# Patient Record
Sex: Male | Born: 1955 | Race: White | Hispanic: No | Marital: Single | Smoking: Former smoker
Health system: Southern US, Community
[De-identification: ages and names within clinical notes are randomized; demographics above are authoritative.]

## PROBLEM LIST (undated history)

## (undated) DIAGNOSIS — I82409 Acute embolism and thrombosis of unspecified deep veins of unspecified lower extremity: Secondary | ICD-10-CM

## (undated) DIAGNOSIS — M549 Dorsalgia, unspecified: Secondary | ICD-10-CM

## (undated) DIAGNOSIS — R55 Syncope and collapse: Secondary | ICD-10-CM

## (undated) DIAGNOSIS — I219 Acute myocardial infarction, unspecified: Secondary | ICD-10-CM

## (undated) DIAGNOSIS — E1151 Type 2 diabetes mellitus with diabetic peripheral angiopathy without gangrene: Secondary | ICD-10-CM

## (undated) DIAGNOSIS — G8929 Other chronic pain: Secondary | ICD-10-CM

## (undated) DIAGNOSIS — I701 Atherosclerosis of renal artery: Secondary | ICD-10-CM

## (undated) DIAGNOSIS — C4491 Basal cell carcinoma of skin, unspecified: Secondary | ICD-10-CM

## (undated) DIAGNOSIS — I639 Cerebral infarction, unspecified: Secondary | ICD-10-CM

## (undated) DIAGNOSIS — R011 Cardiac murmur, unspecified: Secondary | ICD-10-CM

## (undated) DIAGNOSIS — J189 Pneumonia, unspecified organism: Secondary | ICD-10-CM

## (undated) DIAGNOSIS — IMO0001 Reserved for inherently not codable concepts without codable children: Secondary | ICD-10-CM

## (undated) DIAGNOSIS — I714 Abdominal aortic aneurysm, without rupture, unspecified: Secondary | ICD-10-CM

## (undated) DIAGNOSIS — N200 Calculus of kidney: Secondary | ICD-10-CM

## (undated) DIAGNOSIS — I2510 Atherosclerotic heart disease of native coronary artery without angina pectoris: Secondary | ICD-10-CM

## (undated) DIAGNOSIS — I10 Essential (primary) hypertension: Secondary | ICD-10-CM

## (undated) DIAGNOSIS — M1990 Unspecified osteoarthritis, unspecified site: Secondary | ICD-10-CM

## (undated) DIAGNOSIS — I519 Heart disease, unspecified: Secondary | ICD-10-CM

## (undated) DIAGNOSIS — J449 Chronic obstructive pulmonary disease, unspecified: Secondary | ICD-10-CM

## (undated) DIAGNOSIS — N289 Disorder of kidney and ureter, unspecified: Secondary | ICD-10-CM

## (undated) DIAGNOSIS — I739 Peripheral vascular disease, unspecified: Secondary | ICD-10-CM

## (undated) DIAGNOSIS — J42 Unspecified chronic bronchitis: Secondary | ICD-10-CM

## (undated) DIAGNOSIS — J45909 Unspecified asthma, uncomplicated: Secondary | ICD-10-CM

## (undated) DIAGNOSIS — E785 Hyperlipidemia, unspecified: Secondary | ICD-10-CM

## (undated) HISTORY — DX: Acute embolism and thrombosis of unspecified deep veins of unspecified lower extremity: I82.409

## (undated) HISTORY — DX: Type 2 diabetes mellitus with diabetic peripheral angiopathy without gangrene: E11.51

## (undated) HISTORY — PX: BASAL CELL CARCINOMA EXCISION: SHX1214

## (undated) HISTORY — DX: Reserved for inherently not codable concepts without codable children: IMO0001

## (undated) HISTORY — DX: Abdominal aortic aneurysm, without rupture: I71.4

## (undated) HISTORY — PX: LUMBAR LAMINECTOMY: SHX95

## (undated) HISTORY — DX: Atherosclerotic heart disease of native coronary artery without angina pectoris: I25.10

## (undated) HISTORY — PX: ILIAC ARTERY STENT: SHX1786

## (undated) HISTORY — DX: Other chronic pain: G89.29

## (undated) HISTORY — DX: Atherosclerosis of renal artery: I70.1

## (undated) HISTORY — PX: NASAL SINUS SURGERY: SHX719

## (undated) HISTORY — DX: Chronic obstructive pulmonary disease, unspecified: J44.9

## (undated) HISTORY — DX: Heart disease, unspecified: I51.9

## (undated) HISTORY — DX: Dorsalgia, unspecified: M54.9

## (undated) HISTORY — DX: Peripheral vascular disease, unspecified: I73.9

## (undated) HISTORY — PX: LITHOTRIPSY: SUR834

## (undated) HISTORY — DX: Abdominal aortic aneurysm, without rupture, unspecified: I71.40

---

## 1998-04-21 ENCOUNTER — Emergency Department (HOSPITAL_COMMUNITY): Admission: EM | Admit: 1998-04-21 | Discharge: 1998-04-21 | Payer: Self-pay | Admitting: Emergency Medicine

## 1999-05-28 DIAGNOSIS — I219 Acute myocardial infarction, unspecified: Secondary | ICD-10-CM

## 1999-05-28 DIAGNOSIS — I2510 Atherosclerotic heart disease of native coronary artery without angina pectoris: Secondary | ICD-10-CM

## 1999-05-28 HISTORY — PX: CORONARY ANGIOPLASTY WITH STENT PLACEMENT: SHX49

## 1999-05-28 HISTORY — DX: Atherosclerotic heart disease of native coronary artery without angina pectoris: I25.10

## 1999-05-28 HISTORY — DX: Acute myocardial infarction, unspecified: I21.9

## 2003-04-08 DIAGNOSIS — I2510 Atherosclerotic heart disease of native coronary artery without angina pectoris: Secondary | ICD-10-CM | POA: Diagnosis present

## 2012-03-27 DIAGNOSIS — R55 Syncope and collapse: Secondary | ICD-10-CM

## 2012-03-27 DIAGNOSIS — I519 Heart disease, unspecified: Secondary | ICD-10-CM

## 2012-03-27 HISTORY — DX: Syncope and collapse: R55

## 2012-03-27 HISTORY — DX: Heart disease, unspecified: I51.9

## 2012-04-07 ENCOUNTER — Emergency Department (HOSPITAL_COMMUNITY): Payer: Federal, State, Local not specified - PPO

## 2012-04-07 ENCOUNTER — Encounter (HOSPITAL_COMMUNITY): Payer: Self-pay | Admitting: Emergency Medicine

## 2012-04-07 ENCOUNTER — Inpatient Hospital Stay (HOSPITAL_COMMUNITY)
Admission: EM | Admit: 2012-04-07 | Discharge: 2012-04-08 | DRG: 132 | Disposition: A | Discharge status: Home / Self Care | Payer: Federal, State, Local not specified - PPO | Attending: Cardiovascular Disease | Admitting: Cardiovascular Disease

## 2012-04-07 ENCOUNTER — Inpatient Hospital Stay (HOSPITAL_COMMUNITY): Payer: Federal, State, Local not specified - PPO

## 2012-04-07 DIAGNOSIS — I252 Old myocardial infarction: Secondary | ICD-10-CM

## 2012-04-07 DIAGNOSIS — I498 Other specified cardiac arrhythmias: Secondary | ICD-10-CM | POA: Diagnosis not present

## 2012-04-07 DIAGNOSIS — I739 Peripheral vascular disease, unspecified: Secondary | ICD-10-CM | POA: Diagnosis present

## 2012-04-07 DIAGNOSIS — IMO0001 Reserved for inherently not codable concepts without codable children: Secondary | ICD-10-CM

## 2012-04-07 DIAGNOSIS — Z7982 Long term (current) use of aspirin: Secondary | ICD-10-CM

## 2012-04-07 DIAGNOSIS — Z9861 Coronary angioplasty status: Secondary | ICD-10-CM

## 2012-04-07 DIAGNOSIS — I200 Unstable angina: Secondary | ICD-10-CM

## 2012-04-07 DIAGNOSIS — R0789 Other chest pain: Secondary | ICD-10-CM | POA: Diagnosis present

## 2012-04-07 DIAGNOSIS — E119 Type 2 diabetes mellitus without complications: Secondary | ICD-10-CM | POA: Diagnosis present

## 2012-04-07 DIAGNOSIS — R55 Syncope and collapse: Secondary | ICD-10-CM | POA: Diagnosis present

## 2012-04-07 DIAGNOSIS — Z79899 Other long term (current) drug therapy: Secondary | ICD-10-CM

## 2012-04-07 DIAGNOSIS — E876 Hypokalemia: Secondary | ICD-10-CM | POA: Diagnosis present

## 2012-04-07 DIAGNOSIS — E785 Hyperlipidemia, unspecified: Secondary | ICD-10-CM

## 2012-04-07 DIAGNOSIS — I10 Essential (primary) hypertension: Secondary | ICD-10-CM | POA: Diagnosis present

## 2012-04-07 DIAGNOSIS — Z7902 Long term (current) use of antithrombotics/antiplatelets: Secondary | ICD-10-CM

## 2012-04-07 DIAGNOSIS — F17200 Nicotine dependence, unspecified, uncomplicated: Secondary | ICD-10-CM | POA: Diagnosis present

## 2012-04-07 DIAGNOSIS — I2510 Atherosclerotic heart disease of native coronary artery without angina pectoris: Principal | ICD-10-CM | POA: Diagnosis present

## 2012-04-07 HISTORY — DX: Hyperlipidemia, unspecified: E78.5

## 2012-04-07 HISTORY — DX: Reserved for inherently not codable concepts without codable children: IMO0001

## 2012-04-07 HISTORY — DX: Calculus of kidney: N20.0

## 2012-04-07 HISTORY — DX: Syncope and collapse: R55

## 2012-04-07 HISTORY — DX: Acute myocardial infarction, unspecified: I21.9

## 2012-04-07 LAB — URINALYSIS, ROUTINE W REFLEX MICROSCOPIC
Glucose, UA: NEGATIVE mg/dL
Hgb urine dipstick: NEGATIVE
Protein, ur: 100 mg/dL — AB

## 2012-04-07 LAB — HEPATIC FUNCTION PANEL
AST: 21 U/L (ref 0–37)
Alkaline Phosphatase: 86 U/L (ref 39–117)
Bilirubin, Direct: 0.1 mg/dL (ref 0.0–0.3)
Total Bilirubin: 0.4 mg/dL (ref 0.3–1.2)

## 2012-04-07 LAB — MAGNESIUM: Magnesium: 2.2 mg/dL (ref 1.5–2.5)

## 2012-04-07 LAB — HEPARIN LEVEL (UNFRACTIONATED): Heparin Unfractionated: 0.36 IU/mL (ref 0.30–0.70)

## 2012-04-07 LAB — CK TOTAL AND CKMB (NOT AT ARMC): CK, MB: 1.1 ng/mL (ref 0.3–4.0)

## 2012-04-07 LAB — TROPONIN I
Troponin I: <0.3 ng/mL (ref <0.30)
Troponin I: <0.3 ng/mL (ref <0.30)

## 2012-04-07 LAB — CBC
Hemoglobin: 15.5 g/dL (ref 13.0–17.0)
MCH: 29.7 pg (ref 26.0–34.0)
MCV: 82.4 fL (ref 78.0–100.0)
Platelets: 187 10*3/uL (ref 150–400)
RBC: 5.22 MIL/uL (ref 4.22–5.81)
WBC: 13.8 10*3/uL — ABNORMAL HIGH (ref 4.0–10.5)

## 2012-04-07 LAB — URINE MICROSCOPIC-ADD ON

## 2012-04-07 LAB — BASIC METABOLIC PANEL
CO2: 26 mEq/L (ref 19–32)
Calcium: 9.5 mg/dL (ref 8.4–10.5)
GFR calc non Af Amer: >90 mL/min (ref >90)
Glucose, Bld: 158 mg/dL — ABNORMAL HIGH (ref 70–99)
Potassium: 3.1 mEq/L — ABNORMAL LOW (ref 3.5–5.1)
Sodium: 134 mEq/L — ABNORMAL LOW (ref 135–145)

## 2012-04-07 LAB — PROTIME-INR
INR: 0.99 (ref 0.00–1.49)
Prothrombin Time: 13 seconds (ref 11.6–15.2)

## 2012-04-07 LAB — PRO B NATRIURETIC PEPTIDE: Pro B Natriuretic peptide (BNP): 2035 pg/mL — ABNORMAL HIGH (ref 0–125)

## 2012-04-07 MED ORDER — LOSARTAN POTASSIUM-HCTZ 100-25 MG PO TABS: 1.0000 | ORAL_TABLET | Freq: Every day | ORAL | Status: DC

## 2012-04-07 MED ORDER — ZOLPIDEM TARTRATE 5 MG PO TABS: 5.0000 mg | ORAL_TABLET | Freq: Every evening | ORAL | Status: DC | PRN

## 2012-04-07 MED ORDER — INSULIN ASPART 100 UNIT/ML ~~LOC~~ SOLN
0.0000 [IU] | Freq: Three times a day (TID) | SUBCUTANEOUS | Status: DC
  Administered 2012-04-08: 1 [IU] via SUBCUTANEOUS

## 2012-04-07 MED ORDER — ACETAMINOPHEN 325 MG PO TABS: 650.0000 mg | ORAL_TABLET | ORAL | Status: DC | PRN

## 2012-04-07 MED ORDER — HEPARIN BOLUS VIA INFUSION
3500.0000 [IU] | Freq: Once | INTRAVENOUS | Status: AC
  Administered 2012-04-07: 3500 [IU] via INTRAVENOUS

## 2012-04-07 MED ORDER — ASPIRIN EC 81 MG PO TBEC
81.0000 mg | DELAYED_RELEASE_TABLET | Freq: Every day | ORAL | Status: DC
  Administered 2012-04-08: 81 mg via ORAL
  Filled 2012-04-07: qty 1

## 2012-04-07 MED ORDER — ALBUTEROL SULFATE HFA 108 (90 BASE) MCG/ACT IN AERS: 2.0000 | INHALATION_SPRAY | Freq: Four times a day (QID) | RESPIRATORY_TRACT | Status: DC | PRN

## 2012-04-07 MED ORDER — NITROGLYCERIN 0.4 MG SL SUBL
0.4000 mg | SUBLINGUAL_TABLET | SUBLINGUAL | Status: DC | PRN
  Administered 2012-04-07: 0.4 mg via SUBLINGUAL
  Filled 2012-04-07: qty 25

## 2012-04-07 MED ORDER — ONDANSETRON HCL 4 MG/2ML IJ SOLN: 4.0000 mg | Freq: Four times a day (QID) | INTRAMUSCULAR | Status: DC | PRN

## 2012-04-07 MED ORDER — HYDROCHLOROTHIAZIDE 25 MG PO TABS
25.0000 mg | ORAL_TABLET | Freq: Every day | ORAL | Status: DC
  Administered 2012-04-08: 25 mg via ORAL
  Filled 2012-04-07: qty 1

## 2012-04-07 MED ORDER — FOLIC ACID 1 MG PO TABS
1.0000 mg | ORAL_TABLET | Freq: Two times a day (BID) | ORAL | Status: DC
  Administered 2012-04-07 – 2012-04-08 (×2): 1 mg via ORAL
  Filled 2012-04-07 (×3): qty 1

## 2012-04-07 MED ORDER — HYDROCODONE-ACETAMINOPHEN 5-325 MG PO TABS: 1.0000 | ORAL_TABLET | Freq: Four times a day (QID) | ORAL | Status: DC | PRN

## 2012-04-07 MED ORDER — HEPARIN (PORCINE) IN NACL 100-0.45 UNIT/ML-% IJ SOLN
1200.0000 [IU]/h | INTRAMUSCULAR | Status: DC
  Filled 2012-04-07 (×2): qty 250

## 2012-04-07 MED ORDER — AMLODIPINE-ATORVASTATIN 10-80 MG PO TABS: 1.0000 | ORAL_TABLET | Freq: Every day | ORAL | Status: DC

## 2012-04-07 MED ORDER — POTASSIUM CHLORIDE CRYS ER 20 MEQ PO TBCR
40.0000 meq | EXTENDED_RELEASE_TABLET | Freq: Once | ORAL | Status: AC
  Administered 2012-04-07: 40 meq via ORAL
  Filled 2012-04-07: qty 2

## 2012-04-07 MED ORDER — CLOPIDOGREL BISULFATE 75 MG PO TABS
75.0000 mg | ORAL_TABLET | Freq: Every day | ORAL | Status: DC
Start: 2012-04-08 — End: 2012-04-08
  Administered 2012-04-08: 75 mg via ORAL
  Filled 2012-04-07 (×2): qty 1

## 2012-04-07 MED ORDER — LOSARTAN POTASSIUM 50 MG PO TABS
100.0000 mg | ORAL_TABLET | Freq: Every day | ORAL | Status: DC
  Administered 2012-04-08: 100 mg via ORAL
  Filled 2012-04-07: qty 2

## 2012-04-07 MED ORDER — AMLODIPINE BESYLATE 10 MG PO TABS
10.0000 mg | ORAL_TABLET | Freq: Every day | ORAL | Status: DC
  Administered 2012-04-08: 10 mg via ORAL
  Filled 2012-04-07: qty 1

## 2012-04-07 MED ORDER — NITROGLYCERIN 0.4 MG SL SUBL: 0.4000 mg | SUBLINGUAL_TABLET | SUBLINGUAL | Status: DC | PRN

## 2012-04-07 MED ORDER — HEPARIN (PORCINE) IN NACL 100-0.45 UNIT/ML-% IJ SOLN
12.0000 [IU]/kg/h | INTRAMUSCULAR | Status: DC
  Administered 2012-04-07: 12 [IU]/kg/h via INTRAVENOUS
  Filled 2012-04-07: qty 250

## 2012-04-07 MED ORDER — ASPIRIN 81 MG PO CHEW
324.0000 mg | CHEWABLE_TABLET | Freq: Once | ORAL | Status: AC
  Administered 2012-04-07: 324 mg via ORAL
  Filled 2012-04-07: qty 4

## 2012-04-07 MED ORDER — SODIUM CHLORIDE 0.9 % IV SOLN
Freq: Once | INTRAVENOUS | Status: AC
  Administered 2012-04-07: 15:00:00 via INTRAVENOUS

## 2012-04-07 MED ORDER — ATORVASTATIN CALCIUM 80 MG PO TABS
80.0000 mg | ORAL_TABLET | Freq: Every day | ORAL | Status: DC
  Administered 2012-04-08: 80 mg via ORAL
  Filled 2012-04-07: qty 1

## 2012-04-07 MED ORDER — POTASSIUM CHLORIDE CRYS ER 20 MEQ PO TBCR
20.0000 meq | EXTENDED_RELEASE_TABLET | Freq: Two times a day (BID) | ORAL | Status: DC
  Administered 2012-04-07 – 2012-04-08 (×2): 20 meq via ORAL
  Filled 2012-04-07 (×3): qty 1

## 2012-04-07 MED ORDER — LABETALOL HCL 300 MG PO TABS
300.0000 mg | ORAL_TABLET | Freq: Two times a day (BID) | ORAL | Status: DC
  Administered 2012-04-07 – 2012-04-08 (×2): 300 mg via ORAL
  Filled 2012-04-07 (×3): qty 1

## 2012-04-07 NOTE — Progress Notes (Addendum)
ANTICOAGULATION CONSULT NOTE - Initial Consult  Pharmacy Consult for Heparin Indication: Chest Pain - ACS  No Known Allergies  Patient Measurements: Height: 5\' 8"  (172.7 cm) Weight: 152 lb (68.947 kg) IBW/kg (Calculated) : 68.4  Heparin Dosing Weight 68 kg  Vital Signs: Temp: 97.4 F (36.3 C) (11/12 1010) Temp src: Oral (11/12 1010) BP: 147/72 mmHg (11/12 1603) Pulse Rate: 53  (11/12 1603)  Labs:  Basename 04/07/12 1604 04/07/12 1315 04/07/12 1047  HGB -- -- 15.5  HCT -- -- 43.0  PLT -- -- 187  APTT -- 26 --  LABPROT -- 13.0 --  INR -- 0.99 --  HEPARINUNFRC -- -- --  CREATININE -- -- 0.95  CKTOTAL 17 -- --  CKMB 1.1 -- --  TROPONINI <0.30 -- <0.30   Estimated Creatinine Clearance: 85 ml/min (by C-G formula based on Cr of 0.95).  Medical History: Past Medical History  Diagnosis Date  . Diabetes mellitus without complication   . MI (myocardial infarction)   . Kidney stone   . Chest pressure, at rest 04/07/2012  . Near syncope 04/07/2012  . DM (diabetes mellitus), diet controlled 04/07/2012  . Hyperlipidemia 04/07/2012   Medications:  Prescriptions prior to admission  Medication Sig Dispense Refill  . albuterol (PROVENTIL HFA;VENTOLIN HFA) 108 (90 BASE) MCG/ACT inhaler Inhale 2 puffs into the lungs every 6 (six) hours as needed. breathing      . amLODipine-atorvastatin (CADUET) 10-80 MG per tablet Take 1 tablet by mouth daily.      . clopidogrel (PLAVIX) 75 MG tablet Take 75 mg by mouth daily.      . folic acid (FOLVITE) 1 MG tablet Take 1 mg by mouth 2 (two) times daily.      [name] HYDROcodone-acetaminophen (NORCO/VICODIN) 5-325 MG per tablet Take 1 tablet by mouth every 6 (six) hours as needed. Pain      . labetalol (NORMODYNE) 300 MG tablet Take 300 mg by mouth 2 (two) times daily.      [name] losartan-hydrochlorothiazide (HYZAAR) 100-25 MG per tablet Take 1 tablet by mouth daily.      . potassium chloride (K-DUR,KLOR-CON) 10 MEQ tablet Take 20 mEq by mouth 2 (two)  times daily.        Assessment: 56yo male with complaints of chest pressure, weakness and dizziness.  He has a history of STEMI in the past for which he has had a PCI with stent.  He has normal renal function with Creatinine = 0.95 and an estimated clearance of 62ml/min.  His hemoglobin is normal as is his platelets and he has no s/s of bleeding complications.  No noted recent surgical procedures or history of head bleeds.  We have been asked to initiate IV heparin therapy until full cardiac work-up is completed.  Goal of Therapy:  Heparin level 0.3-0.7 units/ml Monitor platelets by anticoagulation protocol: Yes   Plan:   Heparin was started at [facility] with 3500 unit bolus and drip at 850 units/hr (10ut/kg) at 14:30PM.  Will increase rate to 1200 units/hr (~ 17ut/kg)  Obtain a heparin level 8 hours after IV start.  Daily heparin level and CBC to monitor for bleeding.  Nadara Mustard, PharmD., MS Clinical Pharmacist Pager:  [phone] Thank you for allowing pharmacy to be part of this patients care team. 04/07/2012,5:38 PM

## 2012-04-07 NOTE — Progress Notes (Signed)
ANTICOAGULATION CONSULT NOTE - Initial Consult  Pharmacy Consult for Heparin Indication: chest pain/ACS  No Known Allergies  Patient Measurements: Height: 5\' 8"  (172.7 cm) Weight: 152 lb (68.947 kg) IBW/kg (Calculated) : 68.4  Heparin Dosing Weight: 64kg.  Vital Signs: Temp: 97.4 F (36.3 C) (11/12 1010) Temp src: Oral (11/12 1010) BP: 110/62 mmHg (11/12 1108) Pulse Rate: 53  (11/12 1108)  Labs:  Basename 04/07/12 1047  HGB 15.5  HCT 43.0  PLT 187  APTT --  LABPROT --  INR --  HEPARINUNFRC --  CREATININE 0.95  CKTOTAL --  CKMB --  TROPONINI <0.30    Estimated Creatinine Clearance: 85 ml/min (by C-G formula based on Cr of 0.95).   Medical History: Past Medical History  Diagnosis Date  . Diabetes mellitus without complication   . MI (myocardial infarction)   . Kidney stone     Medications:   (Not in a hospital admission)  Assessment:  56yo M presented to [facility] ER with L arm tingling, weakness, nausea, diaphoresis.   Starting heparin for ACS.  No baseline coags drawn yet.  CBC is ok.  Goal of Therapy:  Heparin level 0.3-0.7 units/ml Monitor platelets by anticoagulation protocol: Yes   Plan:   Draw baseline coags.  Heparin 3500 units x 1 then 850 units/hr.  Check heparin level in 8hrs.  Charolotte Eke, PharmD, pager [phone]. 04/07/2012,1:02 PM.

## 2012-04-07 NOTE — ED Notes (Signed)
Pt states he had a near syncopal episode when head of bed raised for x ray, decreased with lowering head of bed.  BP 110/62, hr 56

## 2012-04-07 NOTE — Progress Notes (Signed)
Pt confirmed pt Max [name] at [facility] EPIC updated

## 2012-04-07 NOTE — ED Notes (Signed)
Bed:WA19<BR> Expected date:<BR> Expected time:<BR> Means of arrival:<BR> Comments:<BR> Hold for triage

## 2012-04-07 NOTE — H&P (Signed)
Max [name] is an 56 y.o. male.    Cardiologist:  New  Chief Complaint: chest pressure, weakness dizziness, lt face and arm numbness  HPI: 56 year old W male with history of STEMI in 2001 at [facility] in [place], with RCA stenosis undergoing Stent to RCA.  Has been on Plavix since that time.  He believes he had a stress test a year ago that was ok.   Other history includes diabetes mellitus no longer on medication due to weight loss of 30 pounds since July of this year, hypertension hyperlipidemia, back surgery x3 L5-S1, history kidney stones. Other history includes peripheral vascular disease with 3 stents to the left iliac 2 stents to the right iliac and was told he had right renal artery stenosis but there was nothing that could be done for this. Recent abdominal ultrasound did find his right kidney was smaller than his left.  This morning he began having jaw numbness from his jaw up into his left ear and then his left arm felt somewhat he felt lightheaded and with dark spots in front of his eyes he also had some chest discomfort.  In the ER no acute changes on his EKG and initial cardiac markers are negative.  On exam he was noted to have heart rates in the 50s. Also while in the ER just sitting up on the stretcher caused him to become very dizzy.    Patient has several issues weight loss without trying, history of coronary disease with previous MI and stent to the RCA, and now bradycardia which may or may not be affecting this episode today.    Past Medical History  Diagnosis Date  . Diabetes mellitus without complication   . MI (myocardial infarction)   . Kidney stone   . Chest pressure, at rest 04/07/2012  . Near syncope 04/07/2012  . DM (diabetes mellitus), diet controlled 04/07/2012  . Hyperlipidemia 04/07/2012    Past Surgical History  Procedure Date  . Back surgery   . Coronary angioplasty with stent placement     History reviewed. No pertinent family history. Social  History:  reports that he has been smoking Cigarettes.  He has never used smokeless tobacco. He reports that he does not drink alcohol or use illicit drugs.  Allergies: No Known Allergies  Outpatient medications: Labetalol 300 mg twice a day Albuterol inhaler 2 puffs every 6 hours as needed Caduet 10-80 one daily Plavix 75 mg daily Folic acid 1 mg twice a day Hydrocodone Tylenol 5-325 one tablet every 6 hours as needed for pain Hyzaar 100-25 one daily Potassium 10 mEq, 20 mEq by mouth twice a day   Results for orders placed during the hospital encounter of 04/07/12 (from the past 48 hour(s))  BASIC METABOLIC PANEL     Status: Abnormal   Collection Time   04/07/12 10:47 AM      Component Value Range Comment   Sodium 134 (*) 135 - 145 mEq/L    Potassium 3.1 (*) 3.5 - 5.1 mEq/L    Chloride 96  96 - 112 mEq/L    CO2 26  19 - 32 mEq/L    Glucose, Bld 158 (*) 70 - 99 mg/dL    BUN 12  6 - 23 mg/dL    Creatinine, Ser 4.09  0.50 - 1.35 mg/dL    Calcium 9.5  8.4 - 81.1 mg/dL    GFR calc non Af Amer >90  >90 mL/min    GFR calc Af Amer >90  >  90 mL/min   CBC     Status: Abnormal   Collection Time   04/07/12 10:47 AM      Component Value Range Comment   WBC 13.8 (*) 4.0 - 10.5 K/uL    RBC 5.22  4.22 - 5.81 MIL/uL    Hemoglobin 15.5  13.0 - 17.0 g/dL    HCT 40.9  81.1 - 91.4 %    MCV 82.4  78.0 - 100.0 fL    MCH 29.7  26.0 - 34.0 pg    MCHC 36.0  30.0 - 36.0 g/dL    RDW 78.2  95.6 - 21.3 %    Platelets 187  150 - 400 K/uL   TROPONIN I     Status: Normal   Collection Time   04/07/12 10:47 AM      Component Value Range Comment   Troponin I <0.30  <0.30 ng/mL   APTT     Status: Normal   Collection Time   04/07/12  1:15 PM      Component Value Range Comment   aPTT 26  24 - 37 seconds   PROTIME-INR     Status: Normal   Collection Time   04/07/12  1:15 PM      Component Value Range Comment   Prothrombin Time 13.0  11.6 - 15.2 seconds    INR 0.99  0.00 - 1.49   TROPONIN I      Status: Normal   Collection Time   04/07/12  4:04 PM      Component Value Range Comment   Troponin I <0.30  <0.30 ng/mL   PRO B NATRIURETIC PEPTIDE     Status: Abnormal   Collection Time   04/07/12  4:04 PM      Component Value Range Comment   Pro B Natriuretic peptide (BNP) 2035.0 (*) 0 - 125 pg/mL   CK TOTAL AND CKMB     Status: Normal   Collection Time   04/07/12  4:04 PM      Component Value Range Comment   Total CK 17  7 - 232 U/L    CK, MB 1.1  0.3 - 4.0 ng/mL    Relative Index RELATIVE INDEX IS INVALID  0.0 - 2.5    Ct Head Wo Contrast  04/07/2012  *RADIOLOGY REPORT*  Clinical Data: Left arm numbness  CT HEAD WITHOUT CONTRAST  Technique:  Contiguous axial images were obtained from the base of the skull through the vertex without contrast.  Comparison: None.  Findings: Ventricles are normal in size.  Negative for acute or chronic infarct.  Negative for hemorrhage or mass lesion. Calvarium is intact.  Sinusitis with mucosal edema in the paranasal sinuses.  IMPRESSION: No significant intracranial abnormality.  Sinusitis.   Original Report Authenticated By: Janeece Riggers, M.D.    Dg Chest Port 1 View  04/07/2012  *RADIOLOGY REPORT*  Clinical Data: Chest pain, diaphoresis, weakness, history smoking, coronary stenting  PORTABLE CHEST - 1 VIEW  Comparison: Portable exam 1102 hours without priors for comparison  Findings: Lateral right costophrenic angle excluded. Normal heart size, mediastinal contours, and pulmonary vascularity. Peribronchial thickening without infiltrate, pleural effusion, or pneumothorax. No acute osseous findings.  IMPRESSION: Mild bronchitic changes.   Original Report Authenticated By: Ulyses Southward, M.D.     ROS: General:no colds or fevers, + weight changes With loss of 30 pounds without trying since July of this year Skin:no rashes or ulcers HEENT:no blurred vision, no congestion CV:see HPI PUL:see HPI GI:no diarrhea  constipation or melena, no indigestion GU:no  hematuria, no dysuria MS:no joint pain, no claudication, back pain, with hx of 3 surgerys, Denies claudication Neuro:near syncope earlier today, no lightheadedness Endo:diet controlled diabetes at this time, no thyroid disease   Blood pressure 147/72, pulse 53, temperature 97.4 F (36.3 C), temperature source Oral, resp. rate 15, height 5\' 8"  (1.727 m), weight 68.947 kg (152 lb), SpO2 98.00%. PE: General:Alert oriented white male no acute distress currently pleasant affect Skin:Warm and dry brisk capillary refill HEENT:Normocephalic sclera clear Neck:Supple no JVD no carotid bruits Heart:S1-S2 regular rate and rhythm without murmur gallop rub or click Lungs:Clear without rales or wheezes ZOX:WRUEAVW soft nontender positive bowel sounds do not palpate liver spleen or mass Ext:No edema difficulty palpating pedal pulse Neuro:Near syncope earlier but currently alert and oriented x3 follows commands moves all extremities    Assessment/Plan Principal Problem:  *Near syncope Active Problems:  Chest pressure, at rest  CAD (coronary artery disease) with inf MI in 2001 and stent to RCA  DM (diabetes mellitus), diet controlled  HTN (hypertension)  Hyperlipidemia  PLAN:admit to rule out MI, check CT head, IV heparin, check orthostatic BP, 2D echo.  MD to see.    INGOLD,LAURA R 04/07/2012, 4:55 PM  I seen and evaluated the patient this evening after Ms. Annie Paras, NP. I agree with her findings, examination as well as impression recommendations. He has had 2 sets of negative biomarkers, but BNP elevated, with CXR evidence of pulmonary edema, rales on exam or shortness of breath. On my examination at this time ECG done quite well is no longer any warning symptoms. He did note he had another episode of the near syncope type symptoms of dizziness and blurry with spots in her thighs when sitting at his chest x-ray earlier today. This morning when he developed initial symptoms he drank a glass of orange  juice, ate a candy bar and some fruit without much help. He got concerned when he had the numbness in his left jaw going to see her in her left arm. When asked him what his original MI symptom was 2001, he noted severe chest pain and palate were sharp arteries of her head. He did not have any arm numbness at that time. He denies any shortness of breath.   Agree with ruling out MI and checking orthostatic pressures alone an echocardiogram with bubble study. Also add carotid Dopplers as I heard a soft bruit in his left carotid.  Despite this is not similar to his previous angina he did not have diagnosed diabetes at the time of his MI. He now has had significant weight loss of the last several months with early satiety during meals and has had a relatively significant workup by his primary physician.  This does sound a little bit like gastroparesis and if this the case he may very well have enough diabetes were used like to have atypical symptoms for angina. He certainly is at risk for recurrence of significant  coronary disease -- but also recommend checking a Lexiscan Myoview to evaluate for ischemia.  He's had some relatively significant blood pressure swings here making the checking orthostatic pressures very pertinent especially with his symptoms occurring with sitting up forgetting his chest x-ray. With diabetes and would suggest the possibility of autonomic dystonia related vasopressor type of syncope.  Would also consider possible tilt table test which can be done as an outpatient wasn't ischemic evaluation is performed.  He has been somewhat bradycardic but is on high-dose  of beta blocker monitoring on telemetry to look for any arrhythmias or severe bradycardia.   Hypokalemia on labs -- on replacement doses, reassess in AM.  Time spent by me with the patient: 15 minute and 15 minutes on chart  HARDING,DAVID W, M.D., M.S. [facility] [address] [place], [place]  [address]  [phone] Pager # [phone] 04/07/2012 9:58 PM

## 2012-04-07 NOTE — ED Provider Notes (Addendum)
History     CSN: [id number]  Arrival date & time 04/07/12  1308   First MD Initiated Contact with Patient 04/07/12 1037      Chief Complaint  Patient presents with  . Fatigue    (Consider location/radiation/quality/duration/timing/severity/associated sxs/prior treatment) HPI Patient developed generalized weakness and numbness in his left arm and left face and pain in his left arm onset 9 AM today. Associated symptoms include diaphoresis and nausea. Symptoms worse with exertion and improved with rest denies chest pain. Symptoms similar to heart attack he had several years ago. No treatment prior to coming here. No visual changes. Treated himself with his usual morning medications, including his antihypertensives and Plavix presently the symptoms are mild to moderate. He denies chest pain Past Medical History  Diagnosis Date  . Diabetes mellitus without complication   . MI (myocardial infarction)   . Kidney stone     Past Surgical History  Procedure Date  . Back surgery   . Coronary angioplasty with stent placement     History reviewed. No pertinent family history.  History  Substance Use Topics  . Smoking status: Current Every Day Smoker  . Smokeless tobacco: Not on file  . Alcohol Use: No      Review of Systems  HENT: Negative.   Respiratory: Negative.   Cardiovascular: Negative.   Gastrointestinal: Positive for nausea.  Musculoskeletal: Negative.   Skin: Negative.   Neurological: Positive for weakness and numbness.  Hematological: Negative.   Psychiatric/Behavioral: Negative.   All other systems reviewed and are negative.    Allergies  Review of patient's allergies indicates no known allergies.  Home Medications   Current Outpatient Rx  Name  Route  Sig  Dispense  Refill  . ALBUTEROL SULFATE HFA 108 (90 BASE) MCG/ACT IN AERS   Inhalation   Inhale 2 puffs into the lungs every 6 (six) hours as needed. breathing         . AMLODIPINE-ATORVASTATIN  10-80 MG PO TABS   Oral   Take 1 tablet by mouth daily.         [name] CLOPIDOGREL BISULFATE 75 MG PO TABS   Oral   Take 75 mg by mouth daily.         [name] FOLIC ACID 1 MG PO TABS   Oral   Take 1 mg by mouth 2 (two) times daily.         [name] HYDROCODONE-ACETAMINOPHEN 5-325 MG PO TABS   Oral   Take 1 tablet by mouth every 6 (six) hours as needed. Pain         . LABETALOL HCL 300 MG PO TABS   Oral   Take 300 mg by mouth 2 (two) times daily.         [name] LOSARTAN POTASSIUM-HCTZ 100-25 MG PO TABS   Oral   Take 1 tablet by mouth daily.         [name] POTASSIUM CHLORIDE CRYS ER 10 MEQ PO TBCR   Oral   Take 20 mEq by mouth 2 (two) times daily.           BP 149/82  Pulse 52  Temp 97.4 F (36.3 C) (Oral)  Resp 14  SpO2 98%  Physical Exam  Nursing note and vitals reviewed. Constitutional: He appears well-developed and well-nourished.  HENT:  Head: Normocephalic and atraumatic.  Eyes: Conjunctivae normal are normal. Pupils are equal, round, and reactive to light.  Neck: Neck supple. No tracheal deviation present. No thyromegaly present.  Cardiovascular: Regular rhythm.   No murmur heard.      Bradycardic  Pulmonary/Chest: Effort normal and breath sounds normal.  Abdominal: Soft. Bowel sounds are normal. He exhibits no distension. There is no tenderness.  Musculoskeletal: Normal range of motion. He exhibits no edema and no tenderness.  Neurological: He is alert. Coordination normal.  Skin: Skin is warm and dry. No rash noted.  Psychiatric: He has a normal mood and affect.    ED Course  Procedures (including critical care time)  Date: 04/07/2012  10:12 am  Rate: 50  Rhythm: sinus bradycardia  QRS Axis: normal  Intervals: normal  ST/T Wave abnormalities: nonspecific ST/T changes  Conduction Disutrbances:none  Narrative Interpretation:   Old EKG Reviewed: none available 11:05 a.m. patient states he felt worse after treatment with one sublingual nitroglycerin. Further  nitroglycerin withheld. 11:30 aM he states he feels left arm pain is improved and currently minimal  Labs Reviewed  BASIC METABOLIC PANEL  CBC  TROPONIN I   No results found.  Date: 04/07/2012  Rate: 55  Rhythm: sinus bradycardia  QRS Axis: normal  Intervals: normal  ST/T Wave abnormalities: nonspecific ST/T changes  Conduction Disutrbances:none  Narrative Interpretation:   Old EKG Reviewed: Unchanged from the initial EKG performed at 10:12 AM today  chest x-ray reviewed by m for #4e   No diagnosis found.  Results for orders placed during the hospital encounter of 04/07/12  BASIC METABOLIC PANEL      Component Value Range   Sodium 134 (*) 135 - 145 mEq/L   Potassium 3.1 (*) 3.5 - 5.1 mEq/L   Chloride 96  96 - 112 mEq/L   CO2 26  19 - 32 mEq/L   Glucose, Bld 158 (*) 70 - 99 mg/dL   BUN 12  6 - 23 mg/dL   Creatinine, Ser 1.61  0.50 - 1.35 mg/dL   Calcium 9.5  8.4 - 09.6 mg/dL   GFR calc non Af Amer >90  >90 mL/min   GFR calc Af Amer >90  >90 mL/min  CBC      Component Value Range   WBC 13.8 (*) 4.0 - 10.5 K/uL   RBC 5.22  4.22 - 5.81 MIL/uL   Hemoglobin 15.5  13.0 - 17.0 g/dL   HCT 04.5  40.9 - 81.1 %   MCV 82.4  78.0 - 100.0 fL   MCH 29.7  26.0 - 34.0 pg   MCHC 36.0  30.0 - 36.0 g/dL   RDW 91.4  78.2 - 95.6 %   Platelets 187  150 - 400 K/uL  TROPONIN I      Component Value Range   Troponin I <0.30  <0.30 ng/mL   Dg Chest Port 1 View  04/07/2012  *RADIOLOGY REPORT*  Clinical Data: Chest pain, diaphoresis, weakness, history smoking, coronary stenting  PORTABLE CHEST - 1 VIEW  Comparison: Portable exam 1102 hours without priors for comparison  Findings: Lateral right costophrenic angle excluded. Normal heart size, mediastinal contours, and pulmonary vascularity. Peribronchial thickening without infiltrate, pleural effusion, or pneumothorax. No acute osseous findings.  IMPRESSION: Mild bronchitic changes.   Original Report Authenticated By: Ulyses Southward, M.D.    1:50 PM  patient asymptomatic.   MDM  Concern for unstable angina given the patient's symptoms with exertional component and multiple risk factors. Spoke with [facility]. Will see patient in ED Suggests heparin Diagnoses #1 unstable angina #2 hyperglycemia #3 tobacco abuse  #4 hypokalemia      Doug Sou, MD  04/07/12 1414  4:15 PM patient be transferred to [facility] per cardiology request patient remains asymptomatic presently CRITICAL CARE Performed by: Doug Sou   Total critical care time: 30 minute  Critical care time was exclusive of separately billable procedures and treating other patients.  Critical care was necessary to treat or prevent imminent or life-threatening deterioration.  Critical care was time spent personally by me on the following activities: development of treatment plan with patient and/or surrogate as well as nursing, discussions with consultants, evaluation of patient's response to treatment, examination of patient, obtaining history from patient or surrogate, ordering and performing treatments and interventions, ordering and review of laboratory studies, ordering and review of radiographic studies, pulse oximetry and re-evaluation of patient's condition.  Doug Sou, MD 04/07/12 [id number]

## 2012-04-07 NOTE — H&P (Signed)
I have seen and examined the pt.  Will admit to tele.r/o MI.  Evaluate glucose.  Check ct head.  Will complete H&P.

## 2012-04-07 NOTE — ED Notes (Signed)
Pt states he began having L arm tingling and weakness that spread up to L jaw and ear at 0900.  PT states this was accompanied by nausea and vision changes.

## 2012-04-08 ENCOUNTER — Inpatient Hospital Stay (HOSPITAL_COMMUNITY): Payer: Federal, State, Local not specified - PPO

## 2012-04-08 DIAGNOSIS — R55 Syncope and collapse: Secondary | ICD-10-CM

## 2012-04-08 LAB — LIPID PANEL
Cholesterol: 149 mg/dL (ref 0–200)
Total CHOL/HDL Ratio: 4.8 RATIO

## 2012-04-08 LAB — GLUCOSE, CAPILLARY
Glucose-Capillary: 105 mg/dL — ABNORMAL HIGH (ref 70–99)
Glucose-Capillary: 132 mg/dL — ABNORMAL HIGH (ref 70–99)

## 2012-04-08 LAB — HEPARIN LEVEL (UNFRACTIONATED): Heparin Unfractionated: 0.36 IU/mL (ref 0.30–0.70)

## 2012-04-08 LAB — CBC
Hemoglobin: 14 g/dL (ref 13.0–17.0)
MCH: 29.1 pg (ref 26.0–34.0)
MCV: 83.2 fL (ref 78.0–100.0)
RBC: 4.81 MIL/uL (ref 4.22–5.81)

## 2012-04-08 LAB — BASIC METABOLIC PANEL
CO2: 26 mEq/L (ref 19–32)
Calcium: 8.8 mg/dL (ref 8.4–10.5)
Creatinine, Ser: 1.19 mg/dL (ref 0.50–1.35)
Glucose, Bld: 107 mg/dL — ABNORMAL HIGH (ref 70–99)

## 2012-04-08 LAB — HEMOGLOBIN A1C: Hgb A1c MFr Bld: 6.5 % — ABNORMAL HIGH (ref <5.7)

## 2012-04-08 MED ORDER — TECHNETIUM TC 99M SESTAMIBI GENERIC - CARDIOLITE
10.0000 | Freq: Once | INTRAVENOUS | Status: AC | PRN
  Administered 2012-04-08: 10 via INTRAVENOUS

## 2012-04-08 MED ORDER — REGADENOSON 0.4 MG/5ML IV SOLN
0.4000 mg | Freq: Once | INTRAVENOUS | Status: AC
  Administered 2012-04-08: 0.4 mg via INTRAVENOUS

## 2012-04-08 MED ORDER — TECHNETIUM TC 99M SESTAMIBI GENERIC - CARDIOLITE
30.0000 | Freq: Once | INTRAVENOUS | Status: AC | PRN
  Administered 2012-04-08: 30 via INTRAVENOUS

## 2012-04-08 MED ORDER — ASPIRIN 81 MG PO TBEC: 81.0000 mg | DELAYED_RELEASE_TABLET | Freq: Every day | ORAL | Status: DC

## 2012-04-08 MED ORDER — NITROGLYCERIN 0.4 MG SL SUBL: 0.4000 mg | SUBLINGUAL_TABLET | SUBLINGUAL | Status: AC | PRN

## 2012-04-08 MED ORDER — LABETALOL HCL 200 MG PO TABS: 200.0000 mg | ORAL_TABLET | Freq: Two times a day (BID) | ORAL | Status: DC

## 2012-04-08 MED ORDER — LABETALOL HCL 200 MG PO TABS
200.0000 mg | ORAL_TABLET | Freq: Two times a day (BID) | ORAL | Status: DC
  Filled 2012-04-08: qty 1

## 2012-04-08 NOTE — ED Notes (Signed)
Max [name], [place] at bedside.  Questions asked and answered with verbalized understanding for cardiac stress test with myoview using lexisan. Consent obtained.PA at bedside for start and throughout exam

## 2012-04-08 NOTE — Progress Notes (Signed)
  Echocardiogram 2D Echocardiogram has been performed.  Farrel Demark, RDMS, RVT 04/08/2012, 12:14 PM

## 2012-04-08 NOTE — Progress Notes (Signed)
Subjective:  No chest pain  Objective:  Vital Signs in the last 24 hours: Temp:  [97.4 F (36.3 C)-98.3 F (36.8 C)] 98.1 F (36.7 C) (11/13 0643) Pulse Rate:  [46-80] 66  (11/13 0955) Resp:  [14-18] 18  (11/13 0653) BP: (110-180)/(62-82) 155/81 mmHg (11/13 0954) SpO2:  [97 %-98 %] 98 % (11/13 0643) Weight:  [68.947 kg (152 lb)-70.2 kg (154 lb 12.2 oz)] 70 kg (154 lb 5.2 oz) (11/13 0653)  Intake/Output from previous day:  Intake/Output Summary (Last 24 hours) at 04/08/12 0955 Last data filed at 04/08/12 0700  Gross per 24 hour  Intake 285.35 ml  Output    500 ml  Net -214.65 ml    Physical Exam: General appearance: alert, cooperative and no distress Lungs: clear to auscultation bilaterally Heart: regular rate and rhythm   Rate: 48-80  Rhythm: normal sinus rhythm  Lab Results:  Basename 04/08/12 0425 04/07/12 1047  WBC 14.5* 13.8*  HGB 14.0 15.5  PLT 171 187    Basename 04/08/12 0425 04/07/12 1047  NA 139 134*  K 3.2* 3.1*  CL 102 96  CO2 26 26  GLUCOSE 107* 158*  BUN 15 12  CREATININE 1.19 0.95    Basename 04/07/12 1604 04/07/12 1047  TROPONINI <0.30 <0.30   Hepatic Function Panel  Basename 04/07/12 1603  PROT 6.2  ALBUMIN 3.3*  AST 21  ALT 17  ALKPHOS 86  BILITOT 0.4  BILIDIR 0.1  IBILI 0.3    Basename 04/08/12 0425  CHOL 149    Basename 04/07/12 1315  INR 0.99    Imaging: Ct Head Wo Contrast  04/07/2012  *RADIOLOGY REPORT*  Clinical Data: Left arm numbness  CT HEAD WITHOUT CONTRAST  Technique:  Contiguous axial images were obtained from the base of the skull through the vertex without contrast.  Comparison: None.  Findings: Ventricles are normal in size.  Negative for acute or chronic infarct.  Negative for hemorrhage or mass lesion. Calvarium is intact.  Sinusitis with mucosal edema in the paranasal sinuses.  IMPRESSION: No significant intracranial abnormality.  Sinusitis.   Original Report Authenticated By: Janeece Riggers, M.D.    Dg  Chest Port 1 View  04/07/2012  *RADIOLOGY REPORT*  Clinical Data: Chest pain, diaphoresis, weakness, history smoking, coronary stenting  PORTABLE CHEST - 1 VIEW  Comparison: Portable exam 1102 hours without priors for comparison  Findings: Lateral right costophrenic angle excluded. Normal heart size, mediastinal contours, and pulmonary vascularity. Peribronchial thickening without infiltrate, pleural effusion, or pneumothorax. No acute osseous findings.  IMPRESSION: Mild bronchitic changes.   Original Report Authenticated By: Ulyses Southward, M.D.     Cardiac Studies:  Assessment/Plan:   Principal Problem:  *Near syncope, ? Anginal, ? bradycardia Active Problems:  Chest pressure, at rest  CAD (coronary artery disease) with inf MI in 2001 and stent to RCA  DM (diabetes mellitus), diet controlled  HTN (hypertension)  Hyperlipidemia   Plan-Myoview today.    Corine Shelter PA-C 04/08/2012, 9:55 AM

## 2012-04-08 NOTE — Progress Notes (Signed)
*  PRELIMINARY RESULTS* Vascular Ultrasound Carotid Duplex (Doppler) has been completed.  Preliminary findings: Bilateral:  No evidence of hemodynamically significant internal carotid artery stenosis.   Vertebral artery flow is antegrade.      Farrel Demark, RDMS, RVT 04/08/2012, 12:13 PM

## 2012-04-08 NOTE — ED Notes (Signed)
Completion of myoview cardiac stress test with Lexiscan.  Transported to nuc med for post scans.  Report given to nuc med techs.  Pt VS back to baseline.  No complaints of adverse effects or feelings post scan at this time

## 2012-04-08 NOTE — Discharge Summary (Signed)
Patient ID: Max [name],  MRN: [id number], DOB/AGE: Jan 20, 1956 56 y.o.  Admit date: 04/07/2012 Discharge date: 04/08/2012  Primary Care Provider:  Primary Cardiologist: Dr [name] (new)  Discharge Diagnoses Principal Problem:  *Near syncope Active Problems:  Chest pressure, at rest  CAD (coronary artery disease) with inf MI in 2001 and stent to RCA  DM (diabetes mellitus), diet controlled  HTN (hypertension)  Hyperlipidemia    Procedures: Myoview, Echo   Hospital Course:  56 year old W male with history of STEMI in 2001 at [facility] in [place], with RCA stenosis undergoing Stent to RCA. Has been on Plavix since that time. He believes he had a stress test a year ago that was ok. Other history includes diabetes mellitus no longer on medication due to weight loss of 30 pounds since July of this year, hypertension hyperlipidemia, back surgery x3 L5-S1, history kidney stones. He also has a history of peripheral vascular disease with 3 stents to the left iliac 2 stents to the right iliac and was told recently that he had right renal artery stenosis but there was nothing that could be done for this. Recent abdominal ultrasound did find his right kidney was smaller than his left. He is followed at [facility] but works in [place]. On 04/07/12 he presented to [facility] after an episode of near syncope with vague SSCP. Enzymes were negative. Myoview showed inferior scar with no ischemia. EF was 45%. Plan is for continued medical Rx. He does say he was recently put on Labetalol by his primary MD for HTN. During his hospital stay he was noted to be bradycardic, at times to the low 40s. We cut his Labetalol back at discharge. He will follow up in 2 weeks in our office (at his request). We will obtain an OP monitor as well.   Discharge Vitals:  Blood pressure 118/74, pulse 65, temperature 98.5 F (36.9 C), temperature source Oral, resp. rate 18, height 5\' 8"  (1.727 m), weight 70 kg (154 lb 5.2 oz), SpO2  96.00%.    Labs: Results for orders placed during the hospital encounter of 04/07/12 (from the past 48 hour(s))  BASIC METABOLIC PANEL     Status: Abnormal   Collection Time   04/07/12 10:47 AM      Component Value Range Comment   Sodium 134 (*) 135 - 145 mEq/L    Potassium 3.1 (*) 3.5 - 5.1 mEq/L    Chloride 96  96 - 112 mEq/L    CO2 26  19 - 32 mEq/L    Glucose, Bld 158 (*) 70 - 99 mg/dL    BUN 12  6 - 23 mg/dL    Creatinine, Ser 4.09  0.50 - 1.35 mg/dL    Calcium 9.5  8.4 - 81.1 mg/dL    GFR calc non Af Amer >90  >90 mL/min    GFR calc Af Amer >90  >90 mL/min   CBC     Status: Abnormal   Collection Time   04/07/12 10:47 AM      Component Value Range Comment   WBC 13.8 (*) 4.0 - 10.5 K/uL    RBC 5.22  4.22 - 5.81 MIL/uL    Hemoglobin 15.5  13.0 - 17.0 g/dL    HCT 91.4  78.2 - 95.6 %    MCV 82.4  78.0 - 100.0 fL    MCH 29.7  26.0 - 34.0 pg    MCHC 36.0  30.0 - 36.0 g/dL    RDW 21.3  08.6 -  15.5 %    Platelets 187  150 - 400 K/uL   TROPONIN I     Status: Normal   Collection Time   04/07/12 10:47 AM      Component Value Range Comment   Troponin I <0.30  <0.30 ng/mL   APTT     Status: Normal   Collection Time   04/07/12  1:15 PM      Component Value Range Comment   aPTT 26  24 - 37 seconds   PROTIME-INR     Status: Normal   Collection Time   04/07/12  1:15 PM      Component Value Range Comment   Prothrombin Time 13.0  11.6 - 15.2 seconds    INR 0.99  0.00 - 1.49   TSH     Status: Normal   Collection Time   04/07/12  4:03 PM      Component Value Range Comment   TSH 1.285  0.350 - 4.500 uIU/mL   MAGNESIUM     Status: Normal   Collection Time   04/07/12  4:03 PM      Component Value Range Comment   Magnesium 2.2  1.5 - 2.5 mg/dL   HEMOGLOBIN U2V     Status: Abnormal   Collection Time   04/07/12  4:03 PM      Component Value Range Comment   Hemoglobin A1C 6.5 (*) <5.7 %    Mean Plasma Glucose 140 (*) <117 mg/dL   HEPATIC FUNCTION PANEL     Status: Abnormal    Collection Time   04/07/12  4:03 PM      Component Value Range Comment   Total Protein 6.2  6.0 - 8.3 g/dL    Albumin 3.3 (*) 3.5 - 5.2 g/dL    AST 21  0 - 37 U/L    ALT 17  0 - 53 U/L    Alkaline Phosphatase 86  39 - 117 U/L    Total Bilirubin 0.4  0.3 - 1.2 mg/dL    Bilirubin, Direct 0.1  0.0 - 0.3 mg/dL    Indirect Bilirubin 0.3  0.3 - 0.9 mg/dL   TROPONIN I     Status: Normal   Collection Time   04/07/12  4:04 PM      Component Value Range Comment   Troponin I <0.30  <0.30 ng/mL   PRO B NATRIURETIC PEPTIDE     Status: Abnormal   Collection Time   04/07/12  4:04 PM      Component Value Range Comment   Pro B Natriuretic peptide (BNP) 2035.0 (*) 0 - 125 pg/mL   CK TOTAL AND CKMB     Status: Normal   Collection Time   04/07/12  4:04 PM      Component Value Range Comment   Total CK 17  7 - 232 U/L    CK, MB 1.1  0.3 - 4.0 ng/mL    Relative Index RELATIVE INDEX IS INVALID  0.0 - 2.5   URINALYSIS, ROUTINE W REFLEX MICROSCOPIC     Status: Abnormal   Collection Time   04/07/12  8:17 PM      Component Value Range Comment   Color, Urine YELLOW  YELLOW    APPearance CLEAR  CLEAR    Specific Gravity, Urine 1.010  1.005 - 1.030    pH 6.5  5.0 - 8.0    Glucose, UA NEGATIVE  NEGATIVE mg/dL    Hgb urine dipstick NEGATIVE  NEGATIVE  Bilirubin Urine NEGATIVE  NEGATIVE    Ketones, ur NEGATIVE  NEGATIVE mg/dL    Protein, ur 161 (*) NEGATIVE mg/dL    Urobilinogen, UA 0.2  0.0 - 1.0 mg/dL    Nitrite NEGATIVE  NEGATIVE    Leukocytes, UA NEGATIVE  NEGATIVE   URINE MICROSCOPIC-ADD ON     Status: Normal   Collection Time   04/07/12  8:17 PM      Component Value Range Comment   Squamous Epithelial / LPF RARE  RARE    WBC, UA 0-2  <3 WBC/hpf    RBC / HPF 0-2  <3 RBC/hpf   GLUCOSE, CAPILLARY     Status: Abnormal   Collection Time   04/07/12 10:41 PM      Component Value Range Comment   Glucose-Capillary 142 (*) 70 - 99 mg/dL   HEPARIN LEVEL (UNFRACTIONATED)     Status: Normal    Collection Time   04/07/12 11:12 PM      Component Value Range Comment   Heparin Unfractionated 0.36  0.30 - 0.70 IU/mL   CBC     Status: Abnormal   Collection Time   04/08/12  4:25 AM      Component Value Range Comment   WBC 14.5 (*) 4.0 - 10.5 K/uL    RBC 4.81  4.22 - 5.81 MIL/uL    Hemoglobin 14.0  13.0 - 17.0 g/dL    HCT 09.6  04.5 - 40.9 %    MCV 83.2  78.0 - 100.0 fL    MCH 29.1  26.0 - 34.0 pg    MCHC 35.0  30.0 - 36.0 g/dL    RDW 81.1  91.4 - 78.2 %    Platelets 171  150 - 400 K/uL   BASIC METABOLIC PANEL     Status: Abnormal   Collection Time   04/08/12  4:25 AM      Component Value Range Comment   Sodium 139  135 - 145 mEq/L    Potassium 3.2 (*) 3.5 - 5.1 mEq/L    Chloride 102  96 - 112 mEq/L    CO2 26  19 - 32 mEq/L    Glucose, Bld 107 (*) 70 - 99 mg/dL    BUN 15  6 - 23 mg/dL    Creatinine, Ser 9.56  0.50 - 1.35 mg/dL    Calcium 8.8  8.4 - 21.3 mg/dL    GFR calc non Af Amer 67 (*) >90 mL/min    GFR calc Af Amer 78 (*) >90 mL/min   LIPID PANEL     Status: Abnormal   Collection Time   04/08/12  4:25 AM      Component Value Range Comment   Cholesterol 149  0 - 200 mg/dL    Triglycerides 086  <578 mg/dL    HDL 31 (*) >46 mg/dL    Total CHOL/HDL Ratio 4.8      VLDL 28  0 - 40 mg/dL    LDL Cholesterol 90  0 - 99 mg/dL   HEPARIN LEVEL (UNFRACTIONATED)     Status: Normal   Collection Time   04/08/12  4:25 AM      Component Value Range Comment   Heparin Unfractionated 0.36  0.30 - 0.70 IU/mL   GLUCOSE, CAPILLARY     Status: Abnormal   Collection Time   04/08/12  6:42 AM      Component Value Range Comment   Glucose-Capillary 105 (*) 70 - 99 mg/dL   GLUCOSE,  CAPILLARY     Status: Abnormal   Collection Time   04/08/12 12:35 PM      Component Value Range Comment   Glucose-Capillary 132 (*) 70 - 99 mg/dL    Comment 1 Documented in Chart      Comment 2 Notify RN       Disposition:    Discharge Medications:    Medication List     As of 04/08/2012  4:58  PM    TAKE these medications         albuterol 108 (90 BASE) MCG/ACT inhaler   Commonly known as: PROVENTIL HFA;VENTOLIN HFA   Inhale 2 puffs into the lungs every 6 (six) hours as needed. breathing      amLODipine-atorvastatin 10-80 MG per tablet   Commonly known as: CADUET   Take 1 tablet by mouth daily.      aspirin 81 MG EC tablet   Take 1 tablet (81 mg total) by mouth daily.      clopidogrel 75 MG tablet   Commonly known as: PLAVIX   Take 75 mg by mouth daily.      folic acid 1 MG tablet   Commonly known as: FOLVITE   Take 1 mg by mouth 2 (two) times daily.      HYDROcodone-acetaminophen 5-325 MG per tablet   Commonly known as: NORCO/VICODIN   Take 1 tablet by mouth every 6 (six) hours as needed. Pain      labetalol 200 MG tablet   Commonly known as: NORMODYNE   Take 1 tablet (200 mg total) by mouth 2 (two) times daily.      losartan-hydrochlorothiazide 100-25 MG per tablet   Commonly known as: HYZAAR   Take 1 tablet by mouth daily.      nitroGLYCERIN 0.4 MG SL tablet   Commonly known as: NITROSTAT   Place 1 tablet (0.4 mg total) under the tongue every 5 (five) minutes as needed for chest pain (take for severe chest pressure ot tightness).      potassium chloride 10 MEQ tablet   Commonly known as: K-DUR,KLOR-CON   Take 20 mEq by mouth 2 (two) times daily.        Duration of Discharge Encounter: Greater than 30 minutes including physician time.  Jolene Provost PA-C 04/08/2012 4:58 PM

## 2012-04-08 NOTE — Progress Notes (Signed)
ANTICOAGULATION CONSULT NOTE - Follow Up Consult  Pharmacy Consult for Heparin Indication: Chest Pain - ACS  No Known Allergies  Labs:  Basename 04/08/12 0425 04/07/12 2312 04/07/12 1604 04/07/12 1315 04/07/12 1047  HGB 14.0 -- -- -- 15.5  HCT 40.0 -- -- -- 43.0  PLT 171 -- -- -- 187  APTT -- -- -- 26 --  LABPROT -- -- -- 13.0 --  INR -- -- -- 0.99 --  HEPARINUNFRC 0.36 0.36 -- -- --  CREATININE 1.19 -- -- -- 0.95  CKTOTAL -- -- 17 -- --  CKMB -- -- 1.1 -- --  TROPONINI -- -- <0.30 -- <0.30   Estimated Creatinine Clearance: 67.9 ml/min (by C-G formula based on Cr of 1.19).  Assessment: 56yo male with complaints of chest pressure, weakness and dizziness.  He has a history of STEMI in the past for which he has had a PCI with stent on IV heparin for r/o ACS.   Heparin level (0.36) is at-goal on 1200 units/hr.   Goal of Therapy:  Heparin level 0.3-0.7 units/ml Monitor platelets by anticoagulation protocol: Yes   Plan:  1. Continue IV heparin at 1200 units/hr. 2. Follow-up AM heparin level, CBC.   Okey Regal, PharmD 04/08/2012,9:22 AM

## 2012-04-08 NOTE — ED Notes (Signed)
Says "heart racing" is easing

## 2012-04-08 NOTE — ED Notes (Signed)
Pt denies chest pain, tightness or pressure.  Complains of "heart racing"

## 2012-04-08 NOTE — Progress Notes (Addendum)
Pt. Seen and examined. Agree with the NP/PA-C note as written.  Will review myoview and echocardiogram from today. He is currently chest pain free.  Chrystie Nose, MD, [facility] Attending Cardiologist [facility]  Addendum:  NST shows fixed basal to mid inferior defect, no reversible ischemia. Echo shows mildly reduced LVEF with inferior wall motion abnormality, probably representing old RCA territory infarct. No reversible ischemia. BNP remains elevated?? CHF.  ??diuresis, probably will need outpatient monitoring for his bradycardia. Would start low dose lasix 20 mg po daily. He is on ARB, B-blocker, ASA, Plavix, statin already.  May be able to go home later today.

## 2012-04-08 NOTE — Progress Notes (Signed)
ANTICOAGULATION CONSULT NOTE - Follow Up Consult  Pharmacy Consult for Heparin Indication: Chest Pain - ACS  No Known Allergies  Patient Measurements: Height: 5\' 8"  (172.7 cm) Weight: 154 lb 12.2 oz (70.2 kg) IBW/kg (Calculated) : 68.4  Heparin Dosing Weight 68 kg  Vital Signs: Temp: 98.3 F (36.8 C) (11/12 2141) Temp src: Oral (11/12 2141) BP: 152/73 mmHg (11/12 2141) Pulse Rate: 49  (11/12 2141)  Labs:  Basename 04/07/12 2312 04/07/12 1604 04/07/12 1315 04/07/12 1047  HGB -- -- -- 15.5  HCT -- -- -- 43.0  PLT -- -- -- 187  APTT -- -- 26 --  LABPROT -- -- 13.0 --  INR -- -- 0.99 --  HEPARINUNFRC 0.36 -- -- --  CREATININE -- -- -- 0.95  CKTOTAL -- 17 -- --  CKMB -- 1.1 -- --  TROPONINI -- <0.30 -- <0.30   Estimated Creatinine Clearance: 85 ml/min (by C-G formula based on Cr of 0.95).  Medical History: Past Medical History  Diagnosis Date  . Diabetes mellitus without complication   . MI (myocardial infarction)   . Kidney stone   . Chest pressure, at rest 04/07/2012  . Near syncope 04/07/2012  . DM (diabetes mellitus), diet controlled 04/07/2012  . Hyperlipidemia 04/07/2012   Medications:  Prescriptions prior to admission  Medication Sig Dispense Refill  . albuterol (PROVENTIL HFA;VENTOLIN HFA) 108 (90 BASE) MCG/ACT inhaler Inhale 2 puffs into the lungs every 6 (six) hours as needed. breathing      . amLODipine-atorvastatin (CADUET) 10-80 MG per tablet Take 1 tablet by mouth daily.      . clopidogrel (PLAVIX) 75 MG tablet Take 75 mg by mouth daily.      . folic acid (FOLVITE) 1 MG tablet Take 1 mg by mouth 2 (two) times daily.      [name] HYDROcodone-acetaminophen (NORCO/VICODIN) 5-325 MG per tablet Take 1 tablet by mouth every 6 (six) hours as needed. Pain      . labetalol (NORMODYNE) 300 MG tablet Take 300 mg by mouth 2 (two) times daily.      [name] losartan-hydrochlorothiazide (HYZAAR) 100-25 MG per tablet Take 1 tablet by mouth daily.      . potassium chloride  (K-DUR,KLOR-CON) 10 MEQ tablet Take 20 mEq by mouth 2 (two) times daily.        Assessment: 56yo male with complaints of chest pressure, weakness and dizziness.  He has a history of STEMI in the past for which he has had a PCI with stent on IV heparin for r/o ACS. Heparin level (0.36) is at-goal on 1200 units/hr.   Goal of Therapy:  Heparin level 0.3-0.7 units/ml Monitor platelets by anticoagulation protocol: Yes   Plan:  1. Continue IV heparin at 1200 units/hr. 2. Follow-up AM heparin level, CBC.   Lorre Munroe, PharmD 04/08/2012,12:36 AM

## 2012-05-27 DIAGNOSIS — I639 Cerebral infarction, unspecified: Secondary | ICD-10-CM

## 2012-05-27 HISTORY — DX: Cerebral infarction, unspecified: I63.9

## 2012-06-05 ENCOUNTER — Other Ambulatory Visit (HOSPITAL_COMMUNITY): Payer: Self-pay | Admitting: Cardiology

## 2012-06-05 DIAGNOSIS — I701 Atherosclerosis of renal artery: Secondary | ICD-10-CM

## 2012-06-05 DIAGNOSIS — I10 Essential (primary) hypertension: Secondary | ICD-10-CM

## 2012-07-02 ENCOUNTER — Ambulatory Visit (HOSPITAL_COMMUNITY)
Admission: RE | Admit: 2012-07-02 | Discharge: 2012-07-02 | Disposition: A | Discharge status: Home / Self Care | Payer: Federal, State, Local not specified - PPO | Source: Ambulatory Visit | Attending: Cardiology | Admitting: Cardiology

## 2012-07-02 DIAGNOSIS — I10 Essential (primary) hypertension: Secondary | ICD-10-CM | POA: Insufficient documentation

## 2012-07-02 DIAGNOSIS — I701 Atherosclerosis of renal artery: Secondary | ICD-10-CM | POA: Insufficient documentation

## 2012-07-02 NOTE — Progress Notes (Signed)
Renal Duplex Doppler Completed. Max [name]

## 2012-09-18 ENCOUNTER — Encounter: Payer: Self-pay | Admitting: Cardiology

## 2012-09-24 ENCOUNTER — Encounter: Payer: Self-pay | Admitting: Cardiology

## 2012-10-11 ENCOUNTER — Encounter: Payer: Self-pay | Admitting: Cardiology

## 2012-10-11 DIAGNOSIS — Z720 Tobacco use: Secondary | ICD-10-CM | POA: Insufficient documentation

## 2012-10-11 DIAGNOSIS — E8881 Metabolic syndrome: Secondary | ICD-10-CM | POA: Insufficient documentation

## 2012-10-11 DIAGNOSIS — I739 Peripheral vascular disease, unspecified: Secondary | ICD-10-CM | POA: Insufficient documentation

## 2012-10-12 ENCOUNTER — Encounter: Payer: Self-pay | Admitting: Cardiology

## 2012-10-12 ENCOUNTER — Ambulatory Visit (INDEPENDENT_AMBULATORY_CARE_PROVIDER_SITE_OTHER): Payer: Federal, State, Local not specified - PPO | Admitting: Cardiology

## 2012-10-12 VITALS — BP 160/80 | HR 52 | Ht 68.0 in | Wt 155.6 lb

## 2012-10-12 DIAGNOSIS — I739 Peripheral vascular disease, unspecified: Secondary | ICD-10-CM

## 2012-10-12 DIAGNOSIS — I10 Essential (primary) hypertension: Secondary | ICD-10-CM

## 2012-10-12 DIAGNOSIS — I2510 Atherosclerotic heart disease of native coronary artery without angina pectoris: Secondary | ICD-10-CM

## 2012-10-12 DIAGNOSIS — E8881 Metabolic syndrome: Secondary | ICD-10-CM

## 2012-10-12 DIAGNOSIS — E785 Hyperlipidemia, unspecified: Secondary | ICD-10-CM

## 2012-10-12 MED ORDER — CARVEDILOL 3.125 MG PO TABS: 3.1250 mg | ORAL_TABLET | Freq: Two times a day (BID) | ORAL | Status: DC

## 2012-10-12 NOTE — Patient Instructions (Addendum)
Your doctor is concerned about how much her blood pressure jumps up and down. I am more concerned about the low blood pressures. We discussed a lot of details of a plan to minimize these problems.   We're changing hydralazine to an As Needed only medicine to use in the afternoon and evening for a blood pressures greater than 160 mmHg  A.M. Medications: ~5:30-6 a.m: Carvedilol 1 tablet (3.25 mg) ~8 a.m.: Valsartan and-HCTZ (Diovan, 320-25 mg) -1 tab  P.M. Medications: ~5:30 to 6 p.m.: Carvedilol 1 tablet (3.25 mg) ~8 p.m.: Amlodipine 10 mg  Will have a follow up a PA/NP in roughly 2 weeks and then with me in 4-6 weeks to recheck your pressures. Don't hesitate to contact us if you have any questions or concerns. We will do our best to answer in a timely manner.

## 2012-10-12 NOTE — Progress Notes (Signed)
Patient ID: Max [name], male   DOB: [birth year], 57 y.o.   MRN: [id number] [facility]   Clinic Note: HPI: Max [name] is a 57 y.o. male with a PMH below who presents today for her followup of his labile, previously difficult to control hypertension, coronary artery disease and peripheral arterial disease.  Interval History: This is last visit he has lost about 15 pounds (he claims it to be more not). He has not really had that much in way of problems with claudication. He much as long as blood sugars they have K., his claudications are fine. If they get worse and illnesses with less activity. The also denies any significant symptoms of angina with rest or exertion. No heart type symptoms either.  His major concern today is his labile blood pressures he's been seeing his primary physician several times over the last couple months the adjust his medications. He denies high as 25 mg twice a day of carvedilol and is always down to 13.5 mm to the morning and 2 in the evening. He is all but stopped his hydralazine and remains only on amlodipine and Diovan HCTZ carvedilol for blood pressure control. His is diffusely monitoring his pressures. He notes over the last few weeks has had several lows in the 80s. One day at work it was going down the to a maximal pressure of 80/56 which recorded. He felt tired and fatigued and weak. This was about an hour after taking his morning medications. It took several hours for the back normal and wasn't until the afternoon when his blood pressure got above 1:30 systolic for her to start feeling able to get up and around. He was dizzy not eating well feeling overall generally sick. 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 She had several these episodes of very low blood pressures in the morning that gradually get back to his  baseline in the 140-170 range by the evening. His primary stopped his hydralazine was altogether and significant reduced to the medications.  Also in the interim he has quit smoking cigarettes and is down to minimal nicotine in his E-cigarettes. He is also no longer on any diabetic medications, saying his A1c last check was in the 5 range. Despite his weight loss, he's not really able to note that he increase his activity level beyond his normal over the last several months. He actually is up more to being sick and not eating as well.  Cardiovascular ROS: no chest pain or dyspnea on exertion negative for - edema, irregular heartbeat, loss of consciousness, orthopnea, palpitations, paroxysmal nocturnal dyspnea, rapid heart rate or shortness of breath Additional cardiac review of systems: Lightheadedness & dizzinesscyes, syncope/near-syncope - yes but no true syncope; TIA/amaurosis fugax - he does suggest symptoms consistent with possible mild TIA or a mild stroke, coinciding with his significant hypotensive event. Melena - no, hematochezia no; hematuria - no; nosebleeds - no; claudication - no  No Known Allergies  Current Outpatient Prescriptions  Medication Sig Dispense Refill  . albuterol (PROVENTIL HFA;VENTOLIN HFA) 108 (90 BASE) MCG/ACT inhaler Inhale 2 puffs into the lungs every 6 (six) hours as needed. breathing      . ALPRAZolam (XANAX) 0.5 MG tablet Take 1 tablet in morning and 2 tablet at bedtime      . amLODipine (NORVASC) 10 MG tablet Take in the  evening      . aspirin EC 81 MG EC tablet Take 1 tablet (81 mg total) by mouth daily.      [name] atorvastatin (LIPITOR) 80 MG tablet Take in morning      . carvedilol (COREG) 3.125 MG tablet Take 1 tablet (3.125 mg total) by mouth 2 (two) times daily. Take 1 tablet in the morning and 2 tablets in the night  60 tablet  12  . clopidogrel (PLAVIX) 75 MG tablet Take 75 mg by mouth every morning.       . folic acid (FOLVITE) 1 MG tablet Take 1 mg by  mouth 2 (two) times daily.      [name] HYDROcodone-acetaminophen (NORCO/VICODIN) 5-325 MG per tablet Take 1 tablet by mouth every 6 (six) hours as needed. Pain      . nitroGLYCERIN (NITROSTAT) 0.4 MG SL tablet Place 1 tablet (0.4 mg total) under the tongue every 5 (five) minutes as needed for chest pain (take for severe chest pressure ot tightness).  25 tablet  2  . ONETOUCH VERIO test strip       . potassium chloride (K-DUR) 10 MEQ tablet Take 2 tablets in the morning      . potassium chloride (K-DUR,KLOR-CON) 10 MEQ tablet Take 20 mEq by mouth 2 (two) times daily.      . valsartan-hydrochlorothiazide (DIOVAN-HCT) 320-25 MG per tablet Take 1 tablet by mouth daily.      . hydrALAZINE (APRESOLINE) 25 MG tablet       . labetalol (NORMODYNE) 200 MG tablet Take 1 tablet (200 mg total) by mouth 2 (two) times daily.  60 tablet  5  . niacin (NIASPAN) 500 MG CR tablet        No current facility-administered medications for this visit.    Past Medical History  Diagnosis Date  . CAD (coronary artery disease), native coronary artery     PCI RCA  . MI (myocardial infarction) 2001    at [facility]; PCI - RCA  . LV dysfunction 03/2012    Echo -  EF 45-50%, mod Conc LVH, mid Inf HK  . Normal cardiac stress test 04/07/2012    Lexiscan myoview; EF 45%, no ischemia or infarct  . Hyperlipidemia 04/07/2012  . PAD (peripheral artery disease) 06/2012    with stent-bil. iliac   . DM (diabetes mellitus) type II controlled peripheral vascular disorder   . Right renal artery stenosis 06/2012    near occlusive disease, with small rt kidney  . Back pain, chronic     multiple back surgeries   . History of basal cell carcinoma excision   . AAA (abdominal aortic aneurysm)     mild aneurysmal dilatation  . Kidney stone   . Near syncope 03/2012    Event monitor - PACs, PVCs - no arrhythmia    Past Surgical History  Procedure Laterality Date  . Coronary angioplasty with stent placement  2001    to RCA @  [facility]  . Lumbar laminectomy  1995, 2000,2001    L5-S1  . Iliac artery stent Bilateral     History   Social History  . Marital Status: Single    Spouse Name: N/A    Number of Children: 0  . Years of Education: N/A   Occupational History  .  [employer]   Social History Main Topics  . Smoking status: Former Smoker -- 1.50 packs/day for 35 years    Types: Cigarettes    Quit date: 08/13/2012  .  Smokeless tobacco: Never Used  . Alcohol Use: No  . Drug Use: No  . Sexually Active: Not on file   Other Topics Concern  . Not on file   Social History Narrative  . No narrative on file    ROS: A comprehensive Review of Systems - General ROS: positive for  - weight loss and Notes roughly 40-50 pounds loss in the past 4-5 months. Neurological ROS: positive for - numbness/tingling, weakness and Decreased proprioception in the left hand with decreased sensation left face. He feels like he may have a small infarct the timeframe of his hypotension.  PHYSICAL EXAM BP 160/80  Pulse 52  Ht 5\' 8"  (1.727 m)  Wt 155 lb 9.6 oz (70.58 kg)  BMI 23.66 kg/m2 General appearance: alert, cooperative, appears stated age, no distress and Pressured mood and affect. Perseverates on his blood pressure related symptoms and weight loss. Neck: no JVD, supple, symmetrical, trachea midline and Soft bilateral carotid bruits noted Lungs: normal percussion bilaterally and Nonlabored with mild interstitial sounds and a screw wheeze on forced expiration. Mildly prolonged expiratory phase. Heart: regular rate and rhythm, S1, S2 normal, S4 present, no rub and No murmur Abdomen: soft, non-tender; bowel sounds normal; no masses,  no organomegaly and Soft bruit in the right side. Extremities: extremities normal, atraumatic, no cyanosis or edema and Bilateral femoral bruits Pulses: Mildly diminished 1+ pulses bilateral lower extremities. Skin: No obvious rash or lesions. Neurologic: Mental status: Alert, oriented,  thought content appropriate Cranial nerves: V: facial light touch sensation reduced on the left Sensory: Mildly reduced tactile sense Left hand/fingers Motor: Very faintly reduced left hand grip Gait: Normal  WUJ:[id number] at today: Yes Rate: 52 , Rhythm: Sinus bradycardia; inferior infarct age undetermined -- no significant change  ASSESSMENT: Stable from a coronary artery disease standpoint with no active anginal or heart failure symptoms. Also relatively stable from a peripheral artery disease standpoint with nonlimiting claudication symptoms. His biggest concern is his fluctuating labile blood pressures, with profound orthostatic symptoms even if blood pressures in the 120 mmHg range. He has however had drops into the mmHg range which would definitely make him symptomatic.  PLAN: Refilled necessary medications. The plan per problem list below  Will have a follow up a PA/NP in roughly 2 weeks and then with me in 4-6 weeks to recheck your pressures.  Marykay Lex, M.D., M.S. [facility] [address] [place], [place]  [address]  [phone] Pager # [phone] 10/12/2012 3:44 PM

## 2012-10-13 ENCOUNTER — Encounter: Payer: Self-pay | Admitting: Cardiology

## 2012-10-13 NOTE — Assessment & Plan Note (Signed)
On atorvastatin, followed PCP.

## 2012-10-13 NOTE — Assessment & Plan Note (Signed)
Likely less of an issue with his significant weight loss -- blood pressure better controlled, diabetic control better.  Continue to follow his lipids that were being checked as primary physician.

## 2012-10-13 NOTE — Assessment & Plan Note (Addendum)
He is extremely labile pressures, ranging from as low as the 80s systolic to the 180s systolic.   Despite this, he does not have significant symptoms of  diastolic heart failure.   I simply think that with such a long-standing elevated blood pressure, he does not tolerate blood pressures below 120 systolic. With this in mind the plan will be to space out medications to avoid precipitous drops with the combined medications.  We have talked with a relatively complex regimen which I provided to him:  Changing hydralazine to an As Needed only medicine to use in the afternoon and evening for a blood pressures greater than 160 mmHg  A.M. Medications: ~5:30-6 a.m: Carvedilol 1 tablet (3.25 mg) ~8 a.m.: Valsartan and-HCTZ (Diovan, 320-25 mg) -1 tab  P.M. Medications: ~5:30 to 6 p.m.: Carvedilol 1 tablet (3.25 mg) ~8 p.m.: Amlodipine 10 mg

## 2012-10-13 NOTE — Assessment & Plan Note (Addendum)
No active symptoms of heart failure or angina. He is on a calcium channel blocker as well as beta blocker and ARB. Statin plus dual antiplatelet therapy.  His echo, EKG and stress test all are consistent with prior RCA infarction. No signs of ischemia.

## 2012-10-13 NOTE — Assessment & Plan Note (Signed)
Significant renal arterial stenosis, not likely amenable to percutaneous intervention. Bilateral iliac stenoses, without significant limiting anginal symptoms. Consider lower 40 Dopplers follow next visit with M.D.

## 2012-10-26 ENCOUNTER — Encounter: Payer: Self-pay | Admitting: Physician Assistant

## 2012-10-26 ENCOUNTER — Ambulatory Visit (INDEPENDENT_AMBULATORY_CARE_PROVIDER_SITE_OTHER): Payer: Federal, State, Local not specified - PPO | Admitting: Physician Assistant

## 2012-10-26 VITALS — BP 140/92 | HR 64 | Ht 68.0 in | Wt 148.6 lb

## 2012-10-26 DIAGNOSIS — F17200 Nicotine dependence, unspecified, uncomplicated: Secondary | ICD-10-CM

## 2012-10-26 DIAGNOSIS — Z720 Tobacco use: Secondary | ICD-10-CM

## 2012-10-26 DIAGNOSIS — I10 Essential (primary) hypertension: Secondary | ICD-10-CM

## 2012-10-26 DIAGNOSIS — E876 Hypokalemia: Secondary | ICD-10-CM

## 2012-10-26 DIAGNOSIS — I739 Peripheral vascular disease, unspecified: Secondary | ICD-10-CM

## 2012-10-26 MED ORDER — POTASSIUM CHLORIDE ER 10 MEQ PO TBCR: 10.0000 meq | EXTENDED_RELEASE_TABLET | Freq: Two times a day (BID) | ORAL | Status: DC

## 2012-10-26 MED ORDER — CARVEDILOL 3.125 MG PO TABS: 3.1250 mg | ORAL_TABLET | Freq: Two times a day (BID) | ORAL | Status: DC

## 2012-10-26 NOTE — Progress Notes (Signed)
Date:  10/26/2012   ID:  Milas Gain, DOB 09-Oct-1955, MRN [id number]  PCP:  [name], MD  Primary Cardiologist:  [name]     History of Present Illness: JESHAWN MELUCCI is a 57 y.o. male history of coronary artery disease, attention, peripheral artery-renal artery stenosis disease, diabetes mellitus, abdominal aortic aneurysm, hyperlipidemia.  Patient's last nuclear stress test was done in November 2013 was negative for inducible ischemia. Ejection fraction was measured at 45%. His last set of renal artery Dopplers was February 2014 revealed mild aneurysmal dilatation of the abdominal aorta. SMA greater than 50% diameter reduction. The right main renal artery demonstrated severely abnormal dampened waveforms which is consistent with near occlusion. Left renal artery was normal patent. Right kidney appears small in size with increased echogenicity and thinning cortex. Left kidney appears normal in size symmetrical in shape and normal cortex. The right CIA demonstrated significant elevated velocities reaching 538 cm/s consistent with 79% stenosis.   The patient has problems with elevated blood pressures. He last saw Dr. [name] on May 19.  At which time he made rather complex changes to the patient's medication regiment as far as scheduling. The patient presents today for followup blood pressure check. He states he had previously been feeling weak and drowsy but is feeling much better now he reports his energy level is way up and he feels "immensely" better. 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 Back in March he  was  supposed to see Dr. [name] for symptoms of claudication however he wasn't feeling up to it at that time. Patient states he feels ready to investigate his claudication symptoms. Also reports he is on day 73 without smoking. He is using the nicotine inhalers as a substitute. He denies nausea, vomiting, chest pain, shortness of breath, orthopnea, PND, abdominal  pain, dizziness.   Wt Readings from Last 3 Encounters:  10/26/12 148 lb 9.6 oz (67.405 kg)  10/12/12 155 lb 9.6 oz (70.58 kg)  04/08/12 154 lb 5.2 oz (70 kg)     Past Medical History  Diagnosis Date  . CAD (coronary artery disease), native coronary artery     PCI RCA  . MI (myocardial infarction) 2001    at [facility]; PCI - RCA  . LV dysfunction 03/2012    Echo -  EF 45-50%, mod Conc LVH, mid Inf HK  . Normal cardiac stress test 04/07/2012    Lexiscan myoview; EF 45%, no ischemia or infarct  . Hyperlipidemia 04/07/2012  . PAD (peripheral artery disease) 06/2012    with stent-bil. iliac   . DM (diabetes mellitus) type II controlled peripheral vascular disorder   . Right renal artery stenosis 06/2012    near occlusive disease, with small rt kidney  . Back pain, chronic     multiple back surgeries   . History of basal cell carcinoma excision   . AAA (abdominal aortic aneurysm)     mild aneurysmal dilatation  . Kidney stone   . Near syncope 03/2012    Event monitor - PACs, PVCs - no arrhythmia    Current Outpatient Prescriptions  Medication Sig Dispense Refill  . albuterol (PROVENTIL HFA;VENTOLIN HFA) 108 (90 BASE) MCG/ACT inhaler Inhale 2 puffs into the lungs every 6 (six) hours as needed. breathing      . ALPRAZolam (XANAX) 0.5 MG tablet Take 1 tablet in morning and 2 tablet at bedtime ( Per pt 1/2 tablet at bedtime only)      .  amLODipine (NORVASC) 10 MG tablet Take in the evening      . aspirin 81 MG EC tablet Take 81 mg by mouth daily at 10 pm.      . atorvastatin (LIPITOR) 80 MG tablet Take in morning      . carvedilol (COREG) 3.125 MG tablet Take 1 tablet (3.125 mg total) by mouth 2 (two) times daily. Take 1 tablet in the morning and 1 tablets in the night  90 tablet  5  . clopidogrel (PLAVIX) 75 MG tablet Take 75 mg by mouth every morning.       . folic acid (FOLVITE) 1 MG tablet Take 2 tablet in the morning      . hydrALAZINE (APRESOLINE) 25 MG tablet Take if  blood pressure greater than 160      . HYDROcodone-acetaminophen (NORCO/VICODIN) 5-325 MG per tablet Take 1 tablet by mouth every 6 (six) hours as needed. Pain      . niacin (NIASPAN) 500 MG CR tablet       . nitroGLYCERIN (NITROSTAT) 0.4 MG SL tablet Place 1 tablet (0.4 mg total) under the tongue every 5 (five) minutes as needed for chest pain (take for severe chest pressure ot tightness).  25 tablet  2  . ONETOUCH VERIO test strip       . potassium chloride (K-DUR) 10 MEQ tablet Take 1 tablet (10 mEq total) by mouth 2 (two) times daily. Take 2 tablets in the morning  60 tablet  5  . valsartan-hydrochlorothiazide (DIOVAN-HCT) 320-25 MG per tablet Take 1 tablet by mouth daily.       No current facility-administered medications for this visit.    Allergies:   No Known Allergies  Social History:  The patient  reports that he quit smoking about 2 months ago. His smoking use included Cigarettes. He has a 52.5 pack-year smoking history. He has never used smokeless tobacco. He reports that he does not drink alcohol or use illicit drugs.   ROS:  Please see the history of present illness.  All other systems reviewed and negative.   PHYSICAL EXAM: VS:  BP 140/92  Pulse 64  Ht 5\' 8"  (1.727 m)  Wt 148 lb 9.6 oz (67.405 kg)  BMI 22.6 kg/m2 Well nourished, well developed, in no acute distress HEENT: Pupils are equal round react to light accommodation extraocular movements are intact.  Cardiac: Regular rate and rhythm without murmurs rubs or gallops. Lungs:  clear to auscultation bilaterally, no wheezing, rhonchi or rales Ext: no lower extremity edema.  2+ radial and 1+ PT pulses. Skin: warm and dry Neuro:  Grossly normal      ASSESSMENT AND PLAN:  Problem List Items Addressed This Visit   HTN (hypertension) - Primary (Chronic)     Upon checking patient's blood pressure myself obtained a reading of 140/92. Which appears significantly improved from prior pressures. I couldn't make any changes to  his blood pressure medication at this time. We did refill Coreg and potassium for him.    Relevant Medications      aspirin 81 MG EC tablet      carvedilol (COREG) tablet   Tobacco abuse - complicated by CAD and PAD (Chronic)     Patient is currently 73 without tobacco. He is using nicotine inhalers.    PAD (peripheral artery disease) - sp multiple L & R LE PTA-stenting (Chronic)     Patient is scheduled to see Dr. [name] for possible PV angiogram.    Relevant Medications  aspirin 81 MG EC tablet      carvedilol (COREG) tablet    Other Visit Diagnoses   Hypokalemia        Relevant Medications       potassium chloride (K-DUR) CR tablet

## 2012-10-26 NOTE — Patient Instructions (Signed)
Call the office if you notice for worsening blood pressure readings when taking it at home.  He was scheduled for appointment with Dr. [name] regarding lower extremity pain and peripheral vascular disease

## 2012-10-26 NOTE — Assessment & Plan Note (Signed)
Patient is currently 89 without tobacco. He is using nicotine inhalers.

## 2012-10-26 NOTE — Assessment & Plan Note (Signed)
Upon checking patient's blood pressure myself obtained a reading of 140/92. Which appears significantly improved from prior pressures. I couldn't make any changes to his blood pressure medication at this time. We did refill Coreg and potassium for him.

## 2012-10-26 NOTE — Assessment & Plan Note (Signed)
Patient is scheduled to see Dr. [name] for possible PV angiogram.

## 2012-11-02 ENCOUNTER — Ambulatory Visit: Payer: Federal, State, Local not specified - PPO | Admitting: Cardiology

## 2012-11-09 ENCOUNTER — Ambulatory Visit: Payer: Federal, State, Local not specified - PPO | Admitting: Cardiology

## 2012-11-11 ENCOUNTER — Ambulatory Visit (INDEPENDENT_AMBULATORY_CARE_PROVIDER_SITE_OTHER): Payer: Federal, State, Local not specified - PPO | Admitting: Cardiovascular Disease

## 2012-11-11 ENCOUNTER — Encounter: Payer: Self-pay | Admitting: Cardiovascular Disease

## 2012-11-11 VITALS — BP 152/100 | HR 64 | Ht 68.0 in | Wt 145.0 lb

## 2012-11-11 DIAGNOSIS — Z01818 Encounter for other preprocedural examination: Secondary | ICD-10-CM

## 2012-11-11 DIAGNOSIS — Z79899 Other long term (current) drug therapy: Secondary | ICD-10-CM

## 2012-11-11 DIAGNOSIS — I739 Peripheral vascular disease, unspecified: Secondary | ICD-10-CM

## 2012-11-11 DIAGNOSIS — D689 Coagulation defect, unspecified: Secondary | ICD-10-CM

## 2012-11-11 DIAGNOSIS — I10 Essential (primary) hypertension: Secondary | ICD-10-CM

## 2012-11-11 NOTE — Patient Instructions (Signed)
Dr [name] has ordered lower extremity dopplers to take a better look at your arteries in your legs.    Dr. [name] has ordered a peripheral angiogram to be done at [facility].  This procedure is going to look at the bloodflow in your lower extremities.  If Dr. [name] is able to open up the arteries, you will have to spend one night in the hospital.  If he is not able to open the arteries, you will be able to go home that same day.    After the procedure, you will not be allowed to drive for 3 days or push, pull, or lift anything greater than 10 lbs for one week.    You will be required to have bloodwork and a chest xray prior to your procedure.  Our scheduler will advise you on when these items need to be done.

## 2012-11-11 NOTE — Progress Notes (Signed)
11/11/2012 Max [name]   03-31-1956  [id number]  Primary Physician [name], MD Primary Cardiologist: [name] Max [name]   HPI:  Max [name] is a 57 y.o. male history of coronary artery disease, attention, peripheral artery-renal artery stenosis disease, diabetes mellitus, abdominal aortic aneurysm, hyperlipidemia. Patient's last nuclear stress test was done in November 2013 was negative for inducible ischemia. Ejection fraction was measured at 45%. His last set of renal artery Dopplers was February 2014 revealed mild aneurysmal dilatation of the abdominal aorta. SMA greater than 50% diameter reduction. The right main renal artery demonstrated severely abnormal dampened waveforms which is consistent with near occlusion. Left renal artery was normal patent. Right kidney appears small in size with increased echogenicity and thinning cortex. Left kidney appears normal in size symmetrical in shape and normal cortex. The right CIA demonstrated significant elevated velocities reaching 538 cm/s consistent with 79% stenosis.  The patient has problems with elevated blood pressures. He last saw Dr. [name] on May 19. At which time he made rather complex changes to the patient's medication regiment as far as scheduling. The patient presents today for followup blood pressure check. He states he had previously been feeling weak and drowsy but is feeling much better now he reports his energy level is way up and he feels "immensely" better. 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 Back in March he was supposed to see Dr. [name] for symptoms of claudication however he wasn't feeling up to it at that time. Patient states he feels ready to investigate his claudication symptoms. Also reports he is on day 73 without smoking. He is using the nicotine inhalers as a substitute. He denies nausea, vomiting, chest pain, shortness of breath, orthopnea, PND, abdominal  pain, dizziness. Given his symptoms and prior renal Doppler studies, certainly possible that he has renal vascular hypertension and claudication related to PAD. I'm going to get lower extremity Arjo Dopplers on him and arrange for him to undergo angiography and potential intervention of his right renal artery and bilateral lower extremities.    Current Outpatient Prescriptions  Medication Sig Dispense Refill  . albuterol (PROVENTIL HFA;VENTOLIN HFA) 108 (90 BASE) MCG/ACT inhaler Inhale 2 puffs into the lungs every 6 (six) hours as needed. breathing      . ALPRAZolam (XANAX) 0.5 MG tablet Take 1 tablet in morning and 2 tablet at bedtime ( Per pt 1/2 tablet at bedtime only)      . amLODipine (NORVASC) 10 MG tablet Take in the evening      . aspirin 81 MG EC tablet Take 81 mg by mouth daily at 10 pm.      . atorvastatin (LIPITOR) 80 MG tablet Take in morning      . carvedilol (COREG) 3.125 MG tablet Take 1 tablet (3.125 mg total) by mouth 2 (two) times daily. Take 1 tablet in the morning and 1 tablets in the night  90 tablet  5  . clopidogrel (PLAVIX) 75 MG tablet Take 75 mg by mouth every morning.       . folic acid (FOLVITE) 1 MG tablet Take 2 tablet in the morning      . hydrALAZINE (APRESOLINE) 25 MG tablet Take if blood pressure greater than 160      . HYDROcodone-acetaminophen (NORCO/VICODIN) 5-325 MG per tablet Take 1 tablet by mouth every 6 (six) hours as needed. Pain      . nitroGLYCERIN (NITROSTAT) 0.4 MG SL tablet Place 1  tablet (0.4 mg total) under the tongue every 5 (five) minutes as needed for chest pain (take for severe chest pressure ot tightness).  25 tablet  2  . ONETOUCH VERIO test strip       . potassium chloride (K-DUR) 10 MEQ tablet Take 1 tablet (10 mEq total) by mouth 2 (two) times daily. Take 2 tablets in the morning  60 tablet  5  . valsartan-hydrochlorothiazide (DIOVAN-HCT) 320-25 MG per tablet Take 1 tablet by mouth daily.       No current facility-administered  medications for this visit.    No Known Allergies  History   Social History  . Marital Status: Single    Spouse Name: N/A    Number of Children: 0  . Years of Education: N/A   Occupational History  .  [employer]   Social History Main Topics  . Smoking status: Former Smoker -- 1.50 packs/day for 35 years    Types: Cigarettes    Quit date: 08/13/2012  . Smokeless tobacco: Never Used  . Alcohol Use: No  . Drug Use: No  . Sexually Active: Not on file   Other Topics Concern  . Not on file   Social History Narrative  . No narrative on file     Review of Systems: General: negative for chills, fever, night sweats or weight changes.  Cardiovascular: negative for chest pain, dyspnea on exertion, edema, orthopnea, palpitations, paroxysmal nocturnal dyspnea or shortness of breath Dermatological: negative for rash Respiratory: negative for cough or wheezing Urologic: negative for hematuria Abdominal: negative for nausea, vomiting, diarrhea, bright red blood per rectum, melena, or hematemesis Neurologic: negative for visual changes, syncope, or dizziness All other systems reviewed and are otherwise negative except as noted above.    Blood pressure 152/100, pulse 64, height 5\' 8"  (1.727 m), weight 145 lb (65.772 kg).  General appearance: alert and no distress Neck: no adenopathy, no carotid bruit, no JVD, supple, symmetrical, trachea midline and thyroid not enlarged, symmetric, no tenderness/mass/nodules Lungs: clear to auscultation bilaterally Heart: regular rate and rhythm, S1, S2 normal, no murmur, click, rub or gallop Abdomen: soft, non-tender; bowel sounds normal; no masses,  no organomegaly and his abdominal aorta is pulsatile. He is allowed periumbilical bruit Extremities: extremities normal, atraumatic, no cyanosis or edema and ddiminished pedal pulses bilaterally  EKG not performed today  ASSESSMENT AND PLAN:   HTN (hypertension) He is on multiple  antihypertensive medications with poor control. Renal Dopplers performed in February suggested high-grade preocclusive right renal artery stenosis with a smaller right kidney. It was possible that he has renal vascular hypertension.The plan is to perform abdominal aortography and potential PTA and stenting of his right renal artery if indeed his angiograms it is confirmatory for presumed renal vascular hypertension.  PAD (peripheral artery disease) - sp multiple L & R LE PTA-stenting He has prior stents placed in his iliac arteries over 20 years ago at [facility]. He does have ophthalmic medications. I'm going to get lower extremity until Doppler studies on him after which perform angiography and potential intervention.      [name] FACP,FACC,FAHA, [facility] 11/11/2012 2:37 PM

## 2012-11-11 NOTE — Assessment & Plan Note (Signed)
He has prior stents placed in his iliac arteries over 20 years ago at [facility]. He does have ophthalmic medications. I'm going to get lower extremity until Doppler studies on him after which perform angiography and potential intervention.

## 2012-11-11 NOTE — Assessment & Plan Note (Signed)
He is on multiple antihypertensive medications with poor control. Renal Dopplers performed in February suggested high-grade preocclusive right renal artery stenosis with a smaller right kidney. It was possible that he has renal vascular hypertension.The plan is to perform abdominal aortography and potential PTA and stenting of his right renal artery if indeed his angiograms it is confirmatory for presumed renal vascular hypertension.

## 2012-11-18 ENCOUNTER — Ambulatory Visit (HOSPITAL_COMMUNITY)
Admission: RE | Admit: 2012-11-18 | Discharge: 2012-11-18 | Disposition: A | Discharge status: Home / Self Care | Payer: Federal, State, Local not specified - PPO | Source: Ambulatory Visit | Attending: Cardiovascular Disease | Admitting: Cardiovascular Disease

## 2012-11-18 ENCOUNTER — Encounter: Payer: Self-pay | Admitting: Cardiology

## 2012-11-18 ENCOUNTER — Ambulatory Visit (INDEPENDENT_AMBULATORY_CARE_PROVIDER_SITE_OTHER): Payer: Federal, State, Local not specified - PPO | Admitting: Cardiology

## 2012-11-18 ENCOUNTER — Other Ambulatory Visit: Payer: Self-pay | Admitting: Cardiovascular Disease

## 2012-11-18 VITALS — BP 170/89 | HR 64 | Ht 68.0 in | Wt 142.4 lb

## 2012-11-18 DIAGNOSIS — I10 Essential (primary) hypertension: Secondary | ICD-10-CM

## 2012-11-18 DIAGNOSIS — E785 Hyperlipidemia, unspecified: Secondary | ICD-10-CM

## 2012-11-18 DIAGNOSIS — R42 Dizziness and giddiness: Secondary | ICD-10-CM

## 2012-11-18 DIAGNOSIS — F17200 Nicotine dependence, unspecified, uncomplicated: Secondary | ICD-10-CM

## 2012-11-18 DIAGNOSIS — E8881 Metabolic syndrome: Secondary | ICD-10-CM

## 2012-11-18 DIAGNOSIS — Z01810 Encounter for preprocedural cardiovascular examination: Secondary | ICD-10-CM

## 2012-11-18 DIAGNOSIS — Z720 Tobacco use: Secondary | ICD-10-CM

## 2012-11-18 DIAGNOSIS — R55 Syncope and collapse: Secondary | ICD-10-CM

## 2012-11-18 DIAGNOSIS — I2510 Atherosclerotic heart disease of native coronary artery without angina pectoris: Secondary | ICD-10-CM

## 2012-11-18 DIAGNOSIS — I739 Peripheral vascular disease, unspecified: Secondary | ICD-10-CM

## 2012-11-18 DIAGNOSIS — R0989 Other specified symptoms and signs involving the circulatory and respiratory systems: Secondary | ICD-10-CM

## 2012-11-18 NOTE — Patient Instructions (Addendum)
I think for now, the best plan is to allow your BP to run a bit high to avoid excessive swings.   Lets just keep medications as they are until after your Procedure on July 14.  We can do your FMLA paperwork -- will call when complete.  Will cover you until end of July.  Once your procedure is done, we will probably be able to adjust your medications somewhat.   If your continue to have the near fainting spells, we will have you go for a Tilt Table Test to see if there is a Brain-Heart relex that is "out of order".  I will see you back after your f/u with Dr. [name].

## 2012-11-18 NOTE — Progress Notes (Signed)
Lower Extremity Arterial Duplex Completed. °Max [name] ° °

## 2012-11-20 ENCOUNTER — Telehealth: Payer: Self-pay | Admitting: *Deleted

## 2012-11-20 NOTE — Telephone Encounter (Signed)
Spoke to patient. Informed him that his FMLA forms are ready to be picked up.left forms at the font desk.A copy of the forms scanned.

## 2012-11-24 ENCOUNTER — Encounter (HOSPITAL_COMMUNITY): Payer: Self-pay | Admitting: Pharmacy Technician

## 2012-11-25 NOTE — Progress Notes (Signed)
Patient ID: DONELLE BABA, male   DOB: [birth year], 57 y.o.   MRN: [id number]  Clinic Note: HPI: GIANN OBARA is a 57 y.o. male with a PMH below who presents today for followup of his labile hypertension, and dizziness, as well as peripheral vascular disease. He is a very complicated gentleman who I met for the first time in followup from a visit with Nada Boozer, NP in November 2013. She saw him after an emergency room visit for a near-syncopal episode. He has extensive medical history as a note below including coronary disease, peripheral vascular disease involving bilateral iliac and renal stenting. He said labile blood pressures, required multiple to medications. I him for the first time in January and February 2014 and referred him to Dr. [name], plans to proceed with bilateral renal and large artery angiography based on lotion and Dopplers. His last visit the was May 19. He came in with significant symptoms of left ear G., fatigue and near syncope. He is asked he stated he may have had some knee. He's had blood pressures ranging from the 190s systolic to down in the 80s systolic when taking medications. Very hyperresponsive to antihypertensive regimen. He does continue no claudication symptoms intermittently, but the most recent visit was all about his dizziness, fatigue and near syncope. these symptoms are really kept him from being up and he cannot work starting from back almost in March. He continues to have him to go home or having to take breaks during work because of his labile blood pressures he gets symptomatic when his blood pressure is high with headaches and blurred vision, and then when he his blood pressure low he is fatigued and is almost passing out. I made some rather dramatic adjustments to his blood pressure regimen as noted in my 5/19 note. One aggravation was using hydralazine as a when necessary, that was clearly not a good idea as his blood pressures dropped  dramatically.  He also noticed a very significant weight loss does not really well explained. His lipids and sugars and monitor by primary physician. Any cardiac symptoms of heart failure and angina really been a non-factor, but his claudication has been somewhat limiting.  Interval History: He returns today he again noting lightheadedness and dizziness as well as some mild lower extremity edema with patent pain in his legs right greater than left. Right now his claudications h seems to be bothering him as much as his dizziness. Not only is he fatigued and weak, but he also notes his activity is really limited by pain in his right leg with  mild to moderate levels of walking. He's had at least 2-3 episodes since I last saw him of near-syncope, but has not truly lost consciousness. He simply cannot tolerate blood pressures below 120 mmHg. He just gets fatigued and tired. At the problems as he has dramatic fluxes between pressures in the 180s to pressures in the 80s systolic. He continues to deny any chest pain or shortness of breath rash exertion. No PND or orthopnea. He did have some symptoms that sound somewhat like a TIA last saw him about a month ago, has not had any symptoms like that recently. Just the lightheadedness and dizziness. No true syncope.   No melena, hematochezia or hematuria.  He also has FMLA paperwork to fill out to help him with how he times his abdomen as days of work.  He still uses his electronic cigarette. Has been pretty good about not smoking.  Past Medical History  Diagnosis Date  . CAD (coronary artery disease), native coronary artery     PCI RCA  . MI (myocardial infarction) 2001    at [facility]; PCI - RCA  . LV dysfunction 03/2012    Echo -  EF 45-50%, mod Conc LVH, mid Inf HK  . Normal cardiac stress test 04/07/2012    Lexiscan myoview; EF 45%, no ischemia or infarct  . Hyperlipidemia 04/07/2012  . PAD (peripheral artery disease) 06/2012    with  stent-bil. iliac   . DM (diabetes mellitus) type II controlled peripheral vascular disorder   . Right renal artery stenosis 06/2012    near occlusive disease, with small rt kidney  . Back pain, chronic     multiple back surgeries   . History of basal cell carcinoma excision   . AAA (abdominal aortic aneurysm)     mild aneurysmal dilatation  . Kidney stone   . Near syncope 03/2012    Event monitor - PACs, PVCs - no arrhythmia    Prior Cardiac Evaluation and Past Surgical History: Past Surgical History  Procedure Laterality Date  . Coronary angioplasty with stent placement  2001    to RCA @ [facility]  . Lumbar laminectomy  1995, 2000,2001    L5-S1  . Iliac artery stent Bilateral    No Known Allergies  Current Outpatient Prescriptions  Medication Sig Dispense Refill  . albuterol (PROVENTIL HFA;VENTOLIN HFA) 108 (90 BASE) MCG/ACT inhaler Inhale 2 puffs into the lungs every 6 (six) hours as needed for wheezing or shortness of breath.       . ALPRAZolam (XANAX) 0.5 MG tablet Take 1 tablet in morning and 2 tablet at bedtime ( Per pt 1/2 tablet at bedtime only)      . amLODipine (NORVASC) 10 MG tablet Take 10 mg by mouth every evening.       [name] aspirin 81 MG EC tablet Take 81 mg by mouth every morning.       [name] atorvastatin (LIPITOR) 80 MG tablet Take 80 mg by mouth every morning.       . clopidogrel (PLAVIX) 75 MG tablet Take 75 mg by mouth every morning.       . folic acid (FOLVITE) 1 MG tablet Take 2 tablet in the morning      . HYDROcodone-acetaminophen (NORCO/VICODIN) 5-325 MG per tablet Take 1 tablet by mouth every 4 (four) hours as needed for pain.       . nitroGLYCERIN (NITROSTAT) 0.4 MG SL tablet Place 1 tablet (0.4 mg total) under the tongue every 5 (five) minutes as needed for chest pain (take for severe chest pressure ot tightness).  25 tablet  2  . ONETOUCH VERIO test strip       . valsartan-hydrochlorothiazide (DIOVAN-HCT) 320-25 MG per tablet Take 1 tablet by mouth daily.        . carvedilol (COREG) 3.125 MG tablet Take 3.125 mg by mouth 2 (two) times daily with a meal.      . potassium chloride (K-DUR) 10 MEQ tablet Take 20 mEq by mouth every morning.       No current facility-administered medications for this visit.    History   Social History  . Marital Status: Single    Spouse Name: N/A    Number of Children: 0  . Years of Education: N/A   Occupational History  .  [employer]   Social History Main Topics  . Smoking status: Former Smoker --  1.50 packs/day for 35 years    Types: Cigarettes    Quit date: 08/13/2012  . Smokeless tobacco: Never Used  . Alcohol Use: No  . Drug Use: No  . Sexually Active: Not on file   Other Topics Concern  . Not on file   Social History Narrative  . No narrative on file    ROS: A comprehensive Review of Systems - Negative except Pertinent positives noted above and minor positives below. General ROS: positive for  - fatigue and weight loss Psychological ROS: positive for - anxiety, concentration difficulties and sleep disturbances Gastrointestinal ROS: no abdominal pain, change in bowel habits, or black or bloody stools positive for - Nausea but no vomiting  PHYSICAL EXAM BP 170/89  Pulse 64  Ht 5\' 8"  (1.727 m)  Wt 142 lb 6.4 oz (64.592 kg)  BMI 21.66 kg/m2 General appearance: alert, cooperative, appears stated age, mild distress and anxiousness about his symptoms.  Neck: no JVD and Soft bilateral carotid bruits Lungs: normal percussion bilaterally and Nonlabored, mild interstitial sounds. Intermittent expiratory wheezing. Prolonged expiratory phase. Heart: regular rate and rhythm, S1, S2 normal, no murmur, click, rub or gallop and normal apical impulse Abdomen: soft, non-tender; bowel sounds normal; no masses,  no organomegaly and There is a soft bruit noted in the epigastric area as well as bilateral femoral bruits Extremities: extremities normal, atraumatic, no cyanosis or edema, no edema, redness or  tenderness in the calves or thighs and no ulcers, gangrene or trophic changes Pulses: 1+ bilateral extremities, trace dorsalis pedis on the right Neurologic: Grossly normal  Recent Labs:  ASSESSMENT: Extremely labile blood pressures, this may very well be related to renovascular hypertension. He is extremely sensitive to any medications. My plan for now is to to have some permissive hypertension as he feels better with the blood pressure being high than lower. I'm scared that with these dramatic she swings of blood pressures in response antihypertensives, he may actually have a true syncopal episode.  I did congratulate him on his smoking cessation efforts.  Syncope, near  Dizziness  PLAN: Per problem list.  Followup: After completing his peripheral angiogram and appropriate followup with Dr. [name].  Marykay Lex, M.D., M.S. [facility] [address] [place], [place]  [address]  [phone] Pager # [phone] 11/18/2012 11:49 PM

## 2012-11-26 DIAGNOSIS — R0989 Other specified symptoms and signs involving the circulatory and respiratory systems: Secondary | ICD-10-CM | POA: Insufficient documentation

## 2012-11-26 NOTE — Assessment & Plan Note (Signed)
No active symptoms at this time. Myoview was 7 months ago, no ischemia.

## 2012-11-26 NOTE — Assessment & Plan Note (Signed)
Continued progress. I congratulated him on his efforts.

## 2012-11-26 NOTE — Assessment & Plan Note (Signed)
Most likely related to labile hypertension. Will allow for permissive hypertension until he goes for his peripheral angiography possible renal artery PTCA/stenting with Dr. [name].

## 2012-11-26 NOTE — Assessment & Plan Note (Signed)
His lipids and sugars and followup as primary physician. With his weight loss, I anticipate that his levels should be much improved.

## 2012-11-26 NOTE — Assessment & Plan Note (Signed)
Dr. [name] plans to do renal angiography as well as a abdomino- iliac and bilateral lower extremity runoff angiography.  He will potentially intervene upon the renal arteries as well as his iliac artery based on his symptoms and Doppler findings.

## 2012-11-26 NOTE — Assessment & Plan Note (Signed)
Avoid when necessary hydralazine. Continue current dose of amlodipine and carvedilol as ordered. This is in conjunction with his valsartan/HCTZ. Target blood pressure range for him would be anywhere from 1:30 to once 60/170 systolic. Unfortunately when his blood pressure is down below the 130s, he notes that he and dizziness. This will require gradual reduction in pressures to allow for adjustment of his baroreceptors.

## 2012-12-01 ENCOUNTER — Other Ambulatory Visit: Payer: Self-pay | Admitting: Cardiovascular Disease

## 2012-12-01 LAB — CBC
Platelets: 187 10*3/uL (ref 150–400)
RBC: 4.84 MIL/uL (ref 4.22–5.81)
WBC: 15.4 10*3/uL — ABNORMAL HIGH (ref 4.0–10.5)

## 2012-12-02 LAB — BASIC METABOLIC PANEL
BUN: 18 mg/dL (ref 6–23)
Calcium: 9.1 mg/dL (ref 8.4–10.5)
Chloride: 94 mEq/L — ABNORMAL LOW (ref 96–112)
Creat: 1.17 mg/dL (ref 0.50–1.35)
Creat: 1.09 mg/dL (ref 0.50–1.35)
Glucose, Bld: 83 mg/dL (ref 70–99)
Sodium: 130 mEq/L — ABNORMAL LOW (ref 135–145)

## 2012-12-02 LAB — CBC
MCH: 29.7 pg (ref 26.0–34.0)
MCHC: 34.7 g/dL (ref 30.0–36.0)
Platelets: 188 10*3/uL (ref 150–400)
RBC: 4.81 MIL/uL (ref 4.22–5.81)

## 2012-12-02 LAB — PROTIME-INR: Prothrombin Time: 13 seconds (ref 11.6–15.2)

## 2012-12-02 LAB — APTT: aPTT: 32 seconds (ref 24–37)

## 2012-12-02 LAB — TSH
TSH: 1.444 u[IU]/mL (ref 0.350–4.500)
TSH: 1.482 u[IU]/mL (ref 0.350–4.500)

## 2012-12-07 ENCOUNTER — Ambulatory Visit (HOSPITAL_COMMUNITY)
Admission: RE | Admit: 2012-12-07 | Discharge: 2012-12-08 | Disposition: A | Discharge status: Home / Self Care | Payer: Federal, State, Local not specified - PPO | Source: Ambulatory Visit | Attending: Cardiovascular Disease | Admitting: Cardiovascular Disease

## 2012-12-07 ENCOUNTER — Encounter (HOSPITAL_COMMUNITY): Payer: Self-pay | Admitting: General Practice

## 2012-12-07 ENCOUNTER — Encounter (HOSPITAL_COMMUNITY)
Admission: RE | Disposition: A | Discharge status: Home / Self Care | Payer: Self-pay | Source: Ambulatory Visit | Attending: Cardiovascular Disease

## 2012-12-07 DIAGNOSIS — R0989 Other specified symptoms and signs involving the circulatory and respiratory systems: Secondary | ICD-10-CM | POA: Diagnosis present

## 2012-12-07 DIAGNOSIS — Z9861 Coronary angioplasty status: Secondary | ICD-10-CM | POA: Insufficient documentation

## 2012-12-07 DIAGNOSIS — I70219 Atherosclerosis of native arteries of extremities with intermittent claudication, unspecified extremity: Secondary | ICD-10-CM

## 2012-12-07 DIAGNOSIS — I739 Peripheral vascular disease, unspecified: Secondary | ICD-10-CM

## 2012-12-07 DIAGNOSIS — I10 Essential (primary) hypertension: Secondary | ICD-10-CM | POA: Insufficient documentation

## 2012-12-07 DIAGNOSIS — Z01818 Encounter for other preprocedural examination: Secondary | ICD-10-CM

## 2012-12-07 DIAGNOSIS — I252 Old myocardial infarction: Secondary | ICD-10-CM | POA: Insufficient documentation

## 2012-12-07 DIAGNOSIS — F17200 Nicotine dependence, unspecified, uncomplicated: Secondary | ICD-10-CM | POA: Insufficient documentation

## 2012-12-07 DIAGNOSIS — I2510 Atherosclerotic heart disease of native coronary artery without angina pectoris: Secondary | ICD-10-CM | POA: Insufficient documentation

## 2012-12-07 DIAGNOSIS — I701 Atherosclerosis of renal artery: Secondary | ICD-10-CM

## 2012-12-07 DIAGNOSIS — I708 Atherosclerosis of other arteries: Secondary | ICD-10-CM | POA: Insufficient documentation

## 2012-12-07 DIAGNOSIS — N2889 Other specified disorders of kidney and ureter: Secondary | ICD-10-CM | POA: Insufficient documentation

## 2012-12-07 DIAGNOSIS — I255 Ischemic cardiomyopathy: Secondary | ICD-10-CM | POA: Diagnosis present

## 2012-12-07 DIAGNOSIS — I2589 Other forms of chronic ischemic heart disease: Secondary | ICD-10-CM | POA: Insufficient documentation

## 2012-12-07 DIAGNOSIS — Z720 Tobacco use: Secondary | ICD-10-CM | POA: Diagnosis present

## 2012-12-07 DIAGNOSIS — Z79899 Other long term (current) drug therapy: Secondary | ICD-10-CM | POA: Insufficient documentation

## 2012-12-07 DIAGNOSIS — E785 Hyperlipidemia, unspecified: Secondary | ICD-10-CM | POA: Insufficient documentation

## 2012-12-07 DIAGNOSIS — E119 Type 2 diabetes mellitus without complications: Secondary | ICD-10-CM | POA: Insufficient documentation

## 2012-12-07 HISTORY — PX: LOWER EXTREMITY ANGIOGRAM: SHX5508

## 2012-12-07 HISTORY — DX: Atherosclerosis of renal artery: I70.1

## 2012-12-07 HISTORY — DX: Peripheral vascular disease, unspecified: I73.9

## 2012-12-07 HISTORY — PX: PERCUTANEOUS STENT INTERVENTION: SHX5500

## 2012-12-07 HISTORY — DX: Essential (primary) hypertension: I10

## 2012-12-07 HISTORY — PX: ILIAC ARTERY STENT: SHX1786

## 2012-12-07 LAB — GLUCOSE, CAPILLARY
Glucose-Capillary: 95 mg/dL (ref 70–99)
Glucose-Capillary: 78 mg/dL (ref 70–99)

## 2012-12-07 SURGERY — ANGIOGRAM, LOWER EXTREMITY
Anesthesia: LOCAL | Laterality: Right

## 2012-12-07 MED ORDER — CLOPIDOGREL BISULFATE 75 MG PO TABS: 75.0000 mg | ORAL_TABLET | ORAL | Status: DC

## 2012-12-07 MED ORDER — HYDRALAZINE HCL 20 MG/ML IJ SOLN
10.0000 mg | INTRAMUSCULAR | Status: DC
  Administered 2012-12-07: 10 mg via INTRAVENOUS
  Filled 2012-12-07: qty 1

## 2012-12-07 MED ORDER — ATORVASTATIN CALCIUM 80 MG PO TABS
80.0000 mg | ORAL_TABLET | Freq: Every morning | ORAL | Status: DC
  Administered 2012-12-07: 15:00:00 80 mg via ORAL
  Filled 2012-12-07 (×2): qty 1

## 2012-12-07 MED ORDER — AMLODIPINE BESYLATE 10 MG PO TABS
10.0000 mg | ORAL_TABLET | Freq: Every evening | ORAL | Status: DC
  Administered 2012-12-07: 18:00:00 10 mg via ORAL
  Filled 2012-12-07 (×2): qty 1

## 2012-12-07 MED ORDER — FENTANYL CITRATE 0.05 MG/ML IJ SOLN
INTRAMUSCULAR | Status: AC
  Filled 2012-12-07: qty 2

## 2012-12-07 MED ORDER — ASPIRIN EC 325 MG PO TBEC
325.0000 mg | DELAYED_RELEASE_TABLET | Freq: Every day | ORAL | Status: DC
  Filled 2012-12-07: qty 1

## 2012-12-07 MED ORDER — ONDANSETRON HCL 4 MG/2ML IJ SOLN: 4.0000 mg | Freq: Four times a day (QID) | INTRAMUSCULAR | Status: DC | PRN

## 2012-12-07 MED ORDER — DIAZEPAM 5 MG PO TABS
5.0000 mg | ORAL_TABLET | ORAL | Status: AC
  Administered 2012-12-07: 5 mg via ORAL
  Filled 2012-12-07: qty 1

## 2012-12-07 MED ORDER — MIDAZOLAM HCL 2 MG/2ML IJ SOLN
INTRAMUSCULAR | Status: AC
  Filled 2012-12-07: qty 2

## 2012-12-07 MED ORDER — NITROGLYCERIN 0.4 MG SL SUBL: 0.4000 mg | SUBLINGUAL_TABLET | SUBLINGUAL | Status: DC | PRN

## 2012-12-07 MED ORDER — VALSARTAN-HYDROCHLOROTHIAZIDE 320-25 MG PO TABS: 1.0000 | ORAL_TABLET | Freq: Every day | ORAL | Status: DC

## 2012-12-07 MED ORDER — POTASSIUM CHLORIDE ER 10 MEQ PO TBCR
20.0000 meq | EXTENDED_RELEASE_TABLET | Freq: Every morning | ORAL | Status: DC
  Administered 2012-12-07: 20 meq via ORAL
  Filled 2012-12-07 (×2): qty 2

## 2012-12-07 MED ORDER — SODIUM CHLORIDE 0.9 % IV SOLN
INTRAVENOUS | Status: AC
  Administered 2012-12-07: 11:00:00 via INTRAVENOUS

## 2012-12-07 MED ORDER — ALPRAZOLAM 0.25 MG PO TABS
0.2500 mg | ORAL_TABLET | Freq: Two times a day (BID) | ORAL | Status: DC | PRN
  Administered 2012-12-07: 21:00:00 0.25 mg via ORAL
  Filled 2012-12-07: qty 1

## 2012-12-07 MED ORDER — CLOPIDOGREL BISULFATE 75 MG PO TABS: 75.0000 mg | ORAL_TABLET | Freq: Every day | ORAL | Status: DC

## 2012-12-07 MED ORDER — HYDROCODONE-ACETAMINOPHEN 5-325 MG PO TABS
1.0000 | ORAL_TABLET | ORAL | Status: DC | PRN
  Administered 2012-12-07 (×2): 1 via ORAL
  Filled 2012-12-07 (×2): qty 1

## 2012-12-07 MED ORDER — ASPIRIN 81 MG PO CHEW
324.0000 mg | CHEWABLE_TABLET | ORAL | Status: AC
  Administered 2012-12-07: 324 mg via ORAL
  Filled 2012-12-07: qty 4

## 2012-12-07 MED ORDER — SODIUM CHLORIDE 0.9 % IJ SOLN: 3.0000 mL | INTRAMUSCULAR | Status: DC | PRN

## 2012-12-07 MED ORDER — SODIUM CHLORIDE 0.9 % IV SOLN
INTRAVENOUS | Status: DC
  Administered 2012-12-07: 08:00:00 via INTRAVENOUS

## 2012-12-07 MED ORDER — ALBUTEROL SULFATE HFA 108 (90 BASE) MCG/ACT IN AERS
2.0000 | INHALATION_SPRAY | Freq: Four times a day (QID) | RESPIRATORY_TRACT | Status: DC | PRN
Start: 2012-12-07 — End: 2012-12-08
  Filled 2012-12-07: qty 6.7

## 2012-12-07 MED ORDER — ACETAMINOPHEN 325 MG PO TABS: 650.0000 mg | ORAL_TABLET | ORAL | Status: DC | PRN

## 2012-12-07 MED ORDER — CARVEDILOL 3.125 MG PO TABS
3.1250 mg | ORAL_TABLET | Freq: Two times a day (BID) | ORAL | Status: DC
  Administered 2012-12-07: 18:00:00 3.125 mg via ORAL
  Filled 2012-12-07 (×3): qty 1

## 2012-12-07 MED ORDER — POTASSIUM CHLORIDE CRYS ER 20 MEQ PO TBCR: 40.0000 meq | EXTENDED_RELEASE_TABLET | Freq: Once | ORAL | Status: AC

## 2012-12-07 MED ORDER — POTASSIUM CHLORIDE CRYS ER 20 MEQ PO TBCR
EXTENDED_RELEASE_TABLET | ORAL | Status: AC
  Administered 2012-12-07: 40 meq via ORAL
  Filled 2012-12-07: qty 2

## 2012-12-07 MED ORDER — NITROGLYCERIN 0.2 MG/ML ON CALL CATH LAB
INTRAVENOUS | Status: AC
  Filled 2012-12-07: qty 1

## 2012-12-07 MED ORDER — ASPIRIN 81 MG PO TBEC
81.0000 mg | DELAYED_RELEASE_TABLET | Freq: Every morning | ORAL | Status: DC
  Filled 2012-12-07: qty 1

## 2012-12-07 MED ORDER — LIDOCAINE HCL (PF) 1 % IJ SOLN
INTRAMUSCULAR | Status: AC
  Filled 2012-12-07: qty 30

## 2012-12-07 NOTE — Progress Notes (Signed)
BP 180/93.  Hydralazine given IV.  Tele shows SB 45-60.  Pt states he gets dizzy and feels "horrible" when HR drops below 60 which has been a frequent problem at home.  Pt requests that coreg be changed to something else.  Sticky note left for Dr [name] on chart.  Pt states he has lost 30 pounds in past year without trying.  Pt states he tries to eat healthy to control diabetes but often eats high sugar snacks like milkshakes and snickers candy bars to put on weight.  Admits to eating a lot of fast food.  Pt denies cough or night sweats. Nutrition score updated and nutrition consult initiated per protocol.  HS snack including Ensure plus given.  Pt states he quit smoking 4 months ago and doing well using a nicotine-free electronic cigarette.

## 2012-12-07 NOTE — Progress Notes (Signed)
Dozing quietly, awakened easily.  BP down to 112/62, SR w/ hr up to 70's now.  Pt states dizzy.  Encouraged pt to use urinal sitting or standing on side of bed but  but insisted on getting up to bathroom.  Back to bed without incident.  Voided 1200 cc pale yellow urine.  Yellow arm band on, reminded pt to call for standby assist when getting up.

## 2012-12-07 NOTE — H&P (Signed)
    Pt was reexamined and existing H & P reviewed. No changes found.  [name], MD [facility] 12/07/2012 9:15 AM

## 2012-12-07 NOTE — CV Procedure (Signed)
Max [name] is a 57 y.o. male    [id number] LOCATION:  FACILITY: [facility]  PHYSICIAN: Nanetta Batty, M.D. 1955-08-29   DATE OF PROCEDURE:  12/07/2012  DATE OF DISCHARGE:   CARDIAC CATHETERIZATION     History obtained from chart review. 57 year old Caucasian male patient of Dr. 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 He has had iliac stents performed remotely at [facility]. In addition he has hypertension and hyperlipidemia. His hypertension is labile with complications of headache and presyncope. He is on multiple antihypertensive medications. Renal Doppler suggested a small right kidney with preocclusive flow of motion the Doppler suggested high-grade disease in his right common iliac artery ABI 70.7 range bilaterally. He presented now for angiography and potential percutaneous recatheterization of his right renal artery and right leg.   PROCEDURE DESCRIPTION:    The patient was brought to the second floor [facility] cath lab in the postabsorptive state. He was premedicated with Valium 5 mg by mouth, IV Versed and fentanyl. His right groinwas prepped and shaved in usual sterile fashion. Xylocaine 1% was used for local anesthesia. A 5 French sheath was inserted into the right common femoral artery using standard Seldinger technique. A 5 French pigtail catheter was used for midstream abdominal aortography, distal abdominal aortography and bilateral iliac angiography along with bifemoral runoff using bolus chase digital subtraction step table technique. Visipaque dye was used for the entirety of the case. Retrograde aortic pressure was monitored during the case. A pullback gradient was performed across the right common iliac artery stent using a 5 French endhole catheter after administration of 200 mcg of intra-arterial nitroglycerin via the SideArm sheath. This revealed a 40 mm gradient.   HEMODYNAMICS:      AO SYSTOLIC/AO DIASTOLIC: 166/77    ANGIOGRAPHIC RESULTS:   1: Abdominal aortogram-there is no visible right renal artery. The left renal artery is fully patent. There was a nephrogram noted on the left with contrast filling the renal pelvis and not on the right. The infrarenal abdominal aorta was moderately atherosclerotic and fluoroscopically calcified.  2: Left lower extremity-the left common iliac artery stent was occluded. The left external iliac artery then filled by most likely lumbar collaterals above the bifurcation of the internal and external iliac arteries. The remainder of the left lower circulation was normal with three-vessel runoff the  3: Right lower extremity-there was mild "in-stent restenosis" within the right common iliac artery stent. There was a 60-70% stenosis just proximal to the stent.pullback gradient with a 5 French angled catheter after administration of 200 mcg of intra-arterial glycerin was 40 mm of mercury. The remainder of the right lower extremity was normal three-vessel runoff  IMPRESSION:occluded right renal artery and most likely nonfunctioning right kidney without evidence of a nephrogram. 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 The left common iliac artery stent is occluded though he is more symptomatic on the right. Will proceed with PTA and stenting of the right common iliac artery with an Icast covered stent.   Procedure description: The existing 5 French sheath was exchanged over an 035 Versicore  wire for a 7 French Bright tip sheath. The patient received 5000 units of heparin intravenously and an ACT of 222. A total of 190 cc of contrast was used for the case. The iliac lesion was crossed with a 7 mm x 38 mm long by Cover stent and deployed at 12 atmospheres. We then  post dilated with an 8 x 2 balloon at nominal pressures resulting in reduction of a 75% right common iliac artery stenosis to 0% residual with  excellent flow. The patient tolerated the procedure well. The sheath was secured and the patient left the Cath Lab in stable condition. He will be removed once the ACT falls a lot below 170 and pressure will be held to maintain hemostasis. The patient will be hydrated overnight and continue aspirin and Plavix. He'll be discharged in the morning with followup in my office at which time we will discuss the aged left common iliac artery percutaneous intervention for a chronic total occlusion.   [name], [facility] 12/07/2012 10:29 AM

## 2012-12-08 ENCOUNTER — Encounter (HOSPITAL_COMMUNITY): Payer: Self-pay | Admitting: Cardiology

## 2012-12-08 DIAGNOSIS — I255 Ischemic cardiomyopathy: Secondary | ICD-10-CM | POA: Diagnosis present

## 2012-12-08 DIAGNOSIS — I10 Essential (primary) hypertension: Secondary | ICD-10-CM

## 2012-12-08 DIAGNOSIS — I70219 Atherosclerosis of native arteries of extremities with intermittent claudication, unspecified extremity: Secondary | ICD-10-CM | POA: Diagnosis present

## 2012-12-08 DIAGNOSIS — Z01818 Encounter for other preprocedural examination: Secondary | ICD-10-CM

## 2012-12-08 DIAGNOSIS — I739 Peripheral vascular disease, unspecified: Secondary | ICD-10-CM

## 2012-12-08 LAB — CBC
MCH: 30.2 pg (ref 26.0–34.0)
MCV: 86.6 fL (ref 78.0–100.0)
Platelets: 135 10*3/uL — ABNORMAL LOW (ref 150–400)
RDW: 12.8 % (ref 11.5–15.5)

## 2012-12-08 LAB — BASIC METABOLIC PANEL
CO2: 26 mEq/L (ref 19–32)
Calcium: 8.7 mg/dL (ref 8.4–10.5)
Creatinine, Ser: 1.03 mg/dL (ref 0.50–1.35)
GFR calc Af Amer: >90 mL/min (ref >90)

## 2012-12-08 LAB — POCT ACTIVATED CLOTTING TIME: Activated Clotting Time: 165 seconds

## 2012-12-08 MED ORDER — ACETAMINOPHEN 325 MG PO TABS: 650.0000 mg | ORAL_TABLET | ORAL | Status: DC | PRN

## 2012-12-08 NOTE — Discharge Summary (Signed)
Patient ID: Max [name],  MRN: [id number], DOB/AGE: 1956/05/23 57 y.o.  Admit date: 12/07/2012 Discharge date: 12/08/2012  Primary Care Provider: DR [name] Primary Cardiologist: Dr [name]  Discharge Diagnoses Principal Problem:   Atherosclerotic peripheral vascular disease with intermittent claudication Active Problems:   PAD - sp multiple L & R LE PTA-stenting. RCIA stent 12/07/12   CAD, MI- RCA stent '01. Low risk Myoview 11/13-   Labile hypertension - with significant blood pressure shifts in response to any antihypertensive. Leads to near syncope and significant fatigue   DM (diabetes mellitus), diet controlled; with PAD, CAD   Hyperlipidemia   Tobacco abuse - complicated by CAD and PAD; over 100 days without a cigarette   Cardiomyopathy, ischemic- EF 45% 11/13    Procedures: PVA and PTA / stent RCIA  12/07/12    Hospital Course: Max [name] is a 57 y/o pt with CAD, HTN, DM, and PVD. He is followed by Dr [name]. He was referred to Dr [name] for claudication with documented LE PVD by doppler. See Dr Hazle Coca H&P from 11/11/12 for complete details.  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 The SMA had a greater than 50% diameter reduction. The right main renal artery demonstrated severely abnormal dampened waveforms which is consistent with near occlusion. The left renal artery was patent. His right kidney appeared small in size with increased echogenicity and thinning cortex. The left kidney appears normal in size symmetrical in shape and normal cortex. The right CIA demonstrated significant elevated velocities reaching 538 cm/s consistent with 79% stenosis. He was admitted 12/07/12 for PV angiogram. This revealed an occluded right renal artery and most likely nonfunctioning right kidney.The left common iliac artery stent is occluded though he is more symptomatic on the right. He underwent PTA and  stenting of the right common iliac artery with an Icast covered stent. He was seen by Dr [name] the morning of the 15th and felt to be stable for discharge. He will follow up with Dr [name] in 1-2 weeks to discuss LCIA intervention.     Discharge Vitals:  Blood pressure 112/58, pulse 55, temperature 97.4 F (36.3 C), temperature source Oral, resp. rate 15, height 5\' 8"  (1.727 m), weight 61.825 kg (136 lb 4.8 oz), SpO2 98.00%.    Labs: Results for orders placed during the hospital encounter of 12/07/12 (from the past 48 hour(s))  GLUCOSE, CAPILLARY     Status: None   Collection Time    12/07/12  7:37 AM      Result Value Range   Glucose-Capillary 95  70 - 99 mg/dL  POTASSIUM     Status: Abnormal   Collection Time    12/07/12  8:21 AM      Result Value Range   Potassium 3.2 (*) 3.5 - 5.1 mEq/L  POCT ACTIVATED CLOTTING TIME     Status: None   Collection Time    12/07/12  9:52 AM      Result Value Range   Activated Clotting Time 222    GLUCOSE, CAPILLARY     Status: None   Collection Time    12/07/12 10:30 AM      Result Value Range   Glucose-Capillary 78  70 - 99 mg/dL  POCT ACTIVATED CLOTTING TIME     Status: None   Collection Time    12/07/12 11:00 AM      Result Value Range   Activated Clotting  Time 186    CBC     Status: Abnormal   Collection Time    12/08/12  5:25 AM      Result Value Range   WBC 8.8  4.0 - 10.5 K/uL   RBC 4.54  4.22 - 5.81 MIL/uL   Hemoglobin 13.7  13.0 - 17.0 g/dL   HCT 40.9  81.1 - 91.4 %   MCV 86.6  78.0 - 100.0 fL   MCH 30.2  26.0 - 34.0 pg   MCHC 34.9  30.0 - 36.0 g/dL   RDW 78.2  95.6 - 21.3 %   Platelets 135 (*) 150 - 400 K/uL  BASIC METABOLIC PANEL     Status: Abnormal   Collection Time    12/08/12  5:25 AM      Result Value Range   Sodium 133 (*) 135 - 145 mEq/L   Potassium 4.0  3.5 - 5.1 mEq/L   Comment: DELTA CHECK NOTED   Chloride 101  96 - 112 mEq/L   CO2 26  19 - 32 mEq/L   Glucose, Bld 98  70 - 99 mg/dL   BUN 17  6 - 23 mg/dL    Creatinine, Ser 0.86  0.50 - 1.35 mg/dL   Calcium 8.7  8.4 - 57.8 mg/dL   GFR calc non Af Amer 79 (*) >90 mL/min   GFR calc Af Amer >90  >90 mL/min   Comment:            The eGFR has been calculated     using the CKD EPI equation.     This calculation has not been     validated in all clinical     situations.     eGFR's persistently     <90 mL/min signify     possible Chronic Kidney Disease.    Disposition:      Follow-up Information   Follow up with [name], MD On 12/24/2012. (4pm)    Contact information:   [address] [place] [address] [phone]       Discharge Medications:    Medication List         acetaminophen 325 MG tablet  Commonly known as:  TYLENOL  Take 2 tablets (650 mg total) by mouth every 4 (four) hours as needed.     albuterol 108 (90 BASE) MCG/ACT inhaler  Commonly known as:  PROVENTIL HFA;VENTOLIN HFA  Inhale 2 puffs into the lungs every 6 (six) hours as needed for wheezing or shortness of breath.     ALPRAZolam 0.5 MG tablet  Commonly known as:  XANAX  Take 1 tablet in morning and 2 tablet at bedtime ( Per pt 1/2 tablet at bedtime only)     amLODipine 10 MG tablet  Commonly known as:  NORVASC  Take 10 mg by mouth every evening.     aspirin 81 MG EC tablet  Take 81 mg by mouth every morning.     atorvastatin 80 MG tablet  Commonly known as:  LIPITOR  Take 80 mg by mouth every morning.     carvedilol 3.125 MG tablet  Commonly known as:  COREG  Take 3.125 mg by mouth 2 (two) times daily with a meal.     clopidogrel 75 MG tablet  Commonly known as:  PLAVIX  Take 75 mg by mouth every morning.     folic acid 1 MG tablet  Commonly known as:  FOLVITE  Take 2 tablet in the morning  HYDROcodone-acetaminophen 5-325 MG per tablet  Commonly known as:  NORCO/VICODIN  Take 1 tablet by mouth every 4 (four) hours as needed for pain.     nitroGLYCERIN 0.4 MG SL tablet  Commonly known as:  NITROSTAT  Place  1 tablet (0.4 mg total) under the tongue every 5 (five) minutes as needed for chest pain (take for severe chest pressure ot tightness).     ONETOUCH VERIO test strip  Generic drug:  glucose blood     potassium chloride 10 MEQ tablet  Commonly known as:  K-DUR  Take 20 mEq by mouth every morning.     valsartan-hydrochlorothiazide 320-25 MG per tablet  Commonly known as:  DIOVAN-HCT  Take 1 tablet by mouth daily.         Duration of Discharge Encounter: Greater than 30 minutes including physician time.  Jolene Provost PA-C 12/08/2012 9:16 AM

## 2012-12-08 NOTE — Progress Notes (Signed)
Subjective:  No CP/SOB/Leg pain  Objective:  Temp:  [97.3 F (36.3 C)-97.8 F (36.6 C)] 97.4 F (36.3 C) (07/15 0507) Pulse Rate:  [43-72] 55 (07/15 0507) Resp:  [15-18] 15 (07/15 0507) BP: (88-190)/(55-118) 112/58 mmHg (07/15 0507) SpO2:  [88 %-100 %] 98 % (07/15 0507) Weight:  [136 lb 4.8 oz (61.825 kg)] 136 lb 4.8 oz (61.825 kg) (07/14 2100) Weight change:   Intake/Output from previous day: 07/14 0701 - 07/15 0700 In: 2345 [P.O.:1820; I.V.:525] Out: 3450 [Urine:3450]  Intake/Output from this shift:    Physical Exam: General appearance: alert and no distress Neck: no adenopathy, no carotid bruit, no JVD, supple, symmetrical, trachea midline and thyroid not enlarged, symmetric, no tenderness/mass/nodules Lungs: clear to auscultation bilaterally Heart: regular rate and rhythm, S1, S2 normal, no murmur, click, rub or gallop Extremities: extremities normal, atraumatic, no cyanosis or edema  Lab Results: Results for orders placed during the hospital encounter of 12/07/12 (from the past 48 hour(s))  GLUCOSE, CAPILLARY     Status: None   Collection Time    12/07/12  7:37 AM      Result Value Range   Glucose-Capillary 95  70 - 99 mg/dL  POTASSIUM     Status: Abnormal   Collection Time    12/07/12  8:21 AM      Result Value Range   Potassium 3.2 (*) 3.5 - 5.1 mEq/L  POCT ACTIVATED CLOTTING TIME     Status: None   Collection Time    12/07/12  9:52 AM      Result Value Range   Activated Clotting Time 222    GLUCOSE, CAPILLARY     Status: None   Collection Time    12/07/12 10:30 AM      Result Value Range   Glucose-Capillary 78  70 - 99 mg/dL  POCT ACTIVATED CLOTTING TIME     Status: None   Collection Time    12/07/12 11:00 AM      Result Value Range   Activated Clotting Time 186    CBC     Status: Abnormal   Collection Time    12/08/12  5:25 AM      Result Value Range   WBC 8.8  4.0 - 10.5 K/uL   RBC 4.54  4.22 - 5.81 MIL/uL   Hemoglobin 13.7  13.0 - 17.0  g/dL   HCT 16.1  09.6 - 04.5 %   MCV 86.6  78.0 - 100.0 fL   MCH 30.2  26.0 - 34.0 pg   MCHC 34.9  30.0 - 36.0 g/dL   RDW 40.9  81.1 - 91.4 %   Platelets 135 (*) 150 - 400 K/uL  BASIC METABOLIC PANEL     Status: Abnormal   Collection Time    12/08/12  5:25 AM      Result Value Range   Sodium 133 (*) 135 - 145 mEq/L   Potassium 4.0  3.5 - 5.1 mEq/L   Comment: DELTA CHECK NOTED   Chloride 101  96 - 112 mEq/L   CO2 26  19 - 32 mEq/L   Glucose, Bld 98  70 - 99 mg/dL   BUN 17  6 - 23 mg/dL   Creatinine, Ser 7.82  0.50 - 1.35 mg/dL   Calcium 8.7  8.4 - 95.6 mg/dL   GFR calc non Af Amer 79 (*) >90 mL/min   GFR calc Af Amer >90  >90 mL/min   Comment:  The eGFR has been calculated     using the CKD EPI equation.     This calculation has not been     validated in all clinical     situations.     eGFR's persistently     <90 mL/min signify     possible Chronic Kidney Disease.    Imaging: Imaging results have been reviewed  Assessment/Plan:   1. Active Problems: 2.   * No active hospital problems. * 3.   Time Spent Directly with Patient:  20 minutes  Length of Stay:  LOS: 1 day   S/P RCIA PTA & stenting using an iCAST covered stent. Resiual LCIA CTO. Exam benign. Labs OK. Plan D/C home on ASA and Plavix. ROV with me 1-2 weeks to discuss staged LCIA intervention.  [name] 12/08/2012, 8:05 AM

## 2012-12-08 NOTE — Progress Notes (Signed)
Patient ambulating in the halls independently with steady gait and no complaints, last evening and this morning.

## 2012-12-22 ENCOUNTER — Ambulatory Visit: Payer: Federal, State, Local not specified - PPO | Admitting: Cardiology

## 2012-12-24 ENCOUNTER — Ambulatory Visit (INDEPENDENT_AMBULATORY_CARE_PROVIDER_SITE_OTHER): Payer: Federal, State, Local not specified - PPO | Admitting: Cardiovascular Disease

## 2012-12-24 ENCOUNTER — Encounter: Payer: Self-pay | Admitting: Cardiovascular Disease

## 2012-12-24 VITALS — BP 136/88 | HR 64 | Ht 68.0 in | Wt 146.3 lb

## 2012-12-24 DIAGNOSIS — I701 Atherosclerosis of renal artery: Secondary | ICD-10-CM

## 2012-12-24 DIAGNOSIS — I739 Peripheral vascular disease, unspecified: Secondary | ICD-10-CM

## 2012-12-24 DIAGNOSIS — Z01818 Encounter for other preprocedural examination: Secondary | ICD-10-CM

## 2012-12-24 DIAGNOSIS — Z79899 Other long term (current) drug therapy: Secondary | ICD-10-CM

## 2012-12-24 DIAGNOSIS — D689 Coagulation defect, unspecified: Secondary | ICD-10-CM

## 2012-12-24 NOTE — Assessment & Plan Note (Signed)
I. angiogrammed him on 12/07/12 revealing an occluded right renal artery without a nephrogram suggesting that the kidney was nonfunctioning. He had a chronic total occlusion of the left common and external iliac artery and high-grade "in-stent restenosis" within the right common iliac artery stent with a 40 mm gradient. I stented his right common iliac artery with an eye cast covered stent (7/30 mm) with an excellent angiographic result. His claudication on the right side has resolved and now he's walking longer distances he notices more chronic claudication on the left. He did admit now for potential percutaneous transposition of his left common iliac and external iliac artery CTO

## 2012-12-24 NOTE — Assessment & Plan Note (Signed)
Patient had documentation of totally occluded right renal artery at the time of angiography 12/07/12 with a small nonfunctioning kidney. His renal dimensions were 8.5 cm. He did not have a nephrogram. Does have labile hypertension on multiple anti-hypertensive medications. It is certainly possible that his right renal artery is red and producing and therefore a "Goldblatt kidney". I'm referring him to Dr. Casimiro Needle for further evaluation.he may benefit from selective renal vein renin sampling to determine whether or not he has a renin producing producing kidney and if so nephrectomy although I think this is a fairly aggressive approach to this.

## 2012-12-24 NOTE — Progress Notes (Signed)
12/24/2012 Max [name]   Jan 19, 1956  [id number]  Primary Physician [name], MD Primary Cardiologist: [name] [name]   HPI:  57 year old Caucasian male patient of Dr. 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 He has had iliac stents performed remotely at [facility]. In addition he has hypertension and hyperlipidemia. His hypertension is labile with complications of headache and presyncope. He is on multiple antihypertensive medications. Renal Doppler suggested a small right kidney with preocclusive flow of motion the Doppler suggested high-grade disease in his right common iliac artery ABI 0.7 range bilaterally. He presented now for angiography and potential percutaneous recatheterization of his right renal artery and right leg.I angiogram him at 12/07/12 revealing an occluded right renal artery, occluded left iliac artery, and high-grade right iliac "in-stent restenosis". I did up restenting his right common iliac artery with an eye cast covered stent (7/38 mm). This right lower extremity claudication  Improved significantly now to the point where he is more symptomatic on the left.     Current Outpatient Prescriptions  Medication Sig Dispense Refill  . acetaminophen (TYLENOL) 325 MG tablet Take 2 tablets (650 mg total) by mouth every 4 (four) hours as needed.      [name] albuterol (PROVENTIL HFA;VENTOLIN HFA) 108 (90 BASE) MCG/ACT inhaler Inhale 2 puffs into the lungs every 6 (six) hours as needed for wheezing or shortness of breath.       . ALPRAZolam (XANAX) 0.5 MG tablet Take 1 tablet in morning and 2 tablet at bedtime ( Per pt 1/2 tablet at bedtime only)      . amLODipine (NORVASC) 10 MG tablet Take 10 mg by mouth every evening.       [name] aspirin 81 MG EC tablet Take 81 mg by mouth every morning.       [name] atorvastatin (LIPITOR) 80 MG tablet Take 80 mg by mouth every morning.       .  clopidogrel (PLAVIX) 75 MG tablet Take 75 mg by mouth every morning.       . folic acid (FOLVITE) 1 MG tablet Take 2 tablet in the morning      . hydrALAZINE (APRESOLINE) 25 MG tablet Take 1 tablet by mouth 2 (two) times daily.      [name] HYDROcodone-acetaminophen (NORCO/VICODIN) 5-325 MG per tablet Take 1 tablet by mouth every 4 (four) hours as needed for pain.       [name] KLOR-CON M10 10 MEQ tablet Take 2 tablets by mouth 2 (two) times daily.      . nitroGLYCERIN (NITROSTAT) 0.4 MG SL tablet Place 1 tablet (0.4 mg total) under the tongue every 5 (five) minutes as needed for chest pain (take for severe chest pressure ot tightness).  25 tablet  2  . ONETOUCH VERIO test strip       . valsartan-hydrochlorothiazide (DIOVAN-HCT) 320-25 MG per tablet Take 1 tablet by mouth daily.       No current facility-administered medications for this visit.    Allergies  Allergen Reactions  . Carvedilol Other (See Comments)    Per pt, pule rate decreases and he feels like he will pass out.    History   Social History  . Marital Status: Single    Spouse Name: N/A    Number of Children: 0  . Years of Education: N/A   Occupational History  .  [employer]   Social History Main Topics  . Smoking status: Former  Smoker -- 1.50 packs/day for 35 years    Types: Cigarettes    Quit date: 08/13/2012  . Smokeless tobacco: Never Used  . Alcohol Use: No  . Drug Use: No  . Sexually Active: Not on file   Other Topics Concern  . Not on file   Social History Narrative  . No narrative on file     Review of Systems: General: negative for chills, fever, night sweats or weight changes.  Cardiovascular: negative for chest pain, dyspnea on exertion, edema, orthopnea, palpitations, paroxysmal nocturnal dyspnea or shortness of breath Dermatological: negative for rash Respiratory: negative for cough or wheezing Urologic: negative for hematuria Abdominal: negative for nausea, vomiting, diarrhea, bright red blood per  rectum, melena, or hematemesis Neurologic: negative for visual changes, syncope, or dizziness All other systems reviewed and are otherwise negative except as noted above.    Blood pressure 136/88, pulse 64, height 5\' 8"  (1.727 m), weight 146 lb 4.8 oz (66.361 kg).  General appearance: alert and no distress Neck: no adenopathy, no carotid bruit, no JVD, supple, symmetrical, trachea midline and thyroid not enlarged, symmetric, no tenderness/mass/nodules Lungs: clear to auscultation bilaterally Heart: regular rate and rhythm, S1, S2 normal, no murmur, click, rub or gallop Extremities: extremities normal, atraumatic, no cyanosis or edema  EKG not performed today  ASSESSMENT AND PLAN:   Atherosclerotic peripheral vascular disease with intermittent claudication I. angiogrammed him on 12/07/12 revealing an occluded right renal artery without a nephrogram suggesting that the kidney was nonfunctioning. He had a chronic total occlusion of the left common and external iliac artery and high-grade "in-stent restenosis" within the right common iliac artery stent with a 40 mm gradient. I stented his right common iliac artery with an eye cast covered stent (7/30 mm) with an excellent angiographic result. His claudication on the right side has resolved and now he's walking longer distances he notices more chronic claudication on the left. He did admit now for potential percutaneous transposition of his left common iliac and external iliac artery CTO  Renal artery stenosis Patient had documentation of totally occluded right renal artery at the time of angiography 12/07/12 with a small nonfunctioning kidney. His renal dimensions were 8.5 cm. He did not have a nephrogram. Does have labile hypertension on multiple anti-hypertensive medications. It is certainly possible that his right renal artery is red and producing and therefore a "Goldblatt kidney". I'm referring him to Dr. Casimiro Needle for further evaluation.he  may benefit from selective renal vein renin sampling to determine whether or not he has a renin producing producing kidney and if so nephrectomy although I think this is a fairly aggressive approach to this.      [name] FACP,FACC,FAHA, [facility] 12/24/2012 5:12 PM

## 2012-12-24 NOTE — Patient Instructions (Addendum)
Dr. [name] has ordered a peripheral angiogram (Left iliac CTO) to be done at [facility].  This procedure is going to look at the bloodflow in your lower extremities.  If Dr. [name] is able to open up the arteries, you will have to spend one night in the hospital.  If he is not able to open the arteries, you will be able to go home that same day.    After the procedure, you will not be allowed to drive for 3 days or push, pull, or lift anything greater than 10 lbs for one week.    You will be required to have bloodwork prior to your procedure.  Our scheduler will advise you on when these items need to be done.    Reps: [place] and [place]   Referral to Dr A. Powell for renal vascular hypertension

## 2012-12-28 ENCOUNTER — Encounter: Payer: Self-pay | Admitting: Cardiovascular Disease

## 2013-01-01 ENCOUNTER — Encounter (HOSPITAL_COMMUNITY): Payer: Self-pay | Admitting: Pharmacy Technician

## 2013-01-06 ENCOUNTER — Telehealth: Payer: Self-pay | Admitting: *Deleted

## 2013-01-06 NOTE — Telephone Encounter (Signed)
FMLA papers filled out and ready for pick up.  Pt aware and will pick up tomorrow

## 2013-01-11 ENCOUNTER — Telehealth: Payer: Self-pay | Admitting: Cardiovascular Disease

## 2013-01-11 NOTE — Telephone Encounter (Signed)
Dr [name] was to refer him to a kidney specialist  Says has been 2 weeks or more and still have not heard anything  Please call.

## 2013-01-11 NOTE — Telephone Encounter (Signed)
Message forwarded to Scheduling to contact pt r/t appt.  

## 2013-01-18 LAB — BASIC METABOLIC PANEL
CO2: 27 mEq/L (ref 19–32)
Calcium: 9.6 mg/dL (ref 8.4–10.5)
Creat: 1.14 mg/dL (ref 0.50–1.35)

## 2013-01-18 LAB — CBC
Hemoglobin: 15.9 g/dL (ref 13.0–17.0)
MCHC: 35.2 g/dL (ref 30.0–36.0)

## 2013-01-18 LAB — APTT: aPTT: 27 seconds (ref 24–37)

## 2013-01-19 ENCOUNTER — Encounter: Payer: Self-pay | Admitting: *Deleted

## 2013-01-19 ENCOUNTER — Encounter (HOSPITAL_COMMUNITY)
Admission: RE | Disposition: A | Discharge status: Home / Self Care | Payer: Self-pay | Source: Ambulatory Visit | Attending: Cardiovascular Disease

## 2013-01-19 ENCOUNTER — Ambulatory Visit (HOSPITAL_COMMUNITY)
Admission: RE | Admit: 2013-01-19 | Discharge: 2013-01-20 | Disposition: A | Discharge status: Home / Self Care | Payer: Federal, State, Local not specified - PPO | Source: Ambulatory Visit | Attending: Cardiovascular Disease | Admitting: Cardiovascular Disease

## 2013-01-19 ENCOUNTER — Encounter (HOSPITAL_COMMUNITY): Payer: Self-pay | Admitting: General Practice

## 2013-01-19 DIAGNOSIS — Z720 Tobacco use: Secondary | ICD-10-CM | POA: Diagnosis present

## 2013-01-19 DIAGNOSIS — R0989 Other specified symptoms and signs involving the circulatory and respiratory systems: Secondary | ICD-10-CM | POA: Diagnosis present

## 2013-01-19 DIAGNOSIS — I10 Essential (primary) hypertension: Secondary | ICD-10-CM

## 2013-01-19 DIAGNOSIS — I739 Peripheral vascular disease, unspecified: Secondary | ICD-10-CM

## 2013-01-19 DIAGNOSIS — I701 Atherosclerosis of renal artery: Secondary | ICD-10-CM

## 2013-01-19 DIAGNOSIS — I498 Other specified cardiac arrhythmias: Secondary | ICD-10-CM | POA: Insufficient documentation

## 2013-01-19 DIAGNOSIS — I2589 Other forms of chronic ischemic heart disease: Secondary | ICD-10-CM | POA: Insufficient documentation

## 2013-01-19 DIAGNOSIS — I2510 Atherosclerotic heart disease of native coronary artery without angina pectoris: Secondary | ICD-10-CM | POA: Diagnosis present

## 2013-01-19 DIAGNOSIS — I255 Ischemic cardiomyopathy: Secondary | ICD-10-CM | POA: Diagnosis present

## 2013-01-19 DIAGNOSIS — I70219 Atherosclerosis of native arteries of extremities with intermittent claudication, unspecified extremity: Secondary | ICD-10-CM

## 2013-01-19 DIAGNOSIS — M109 Gout, unspecified: Secondary | ICD-10-CM | POA: Insufficient documentation

## 2013-01-19 DIAGNOSIS — Z01818 Encounter for other preprocedural examination: Secondary | ICD-10-CM

## 2013-01-19 HISTORY — DX: Unspecified asthma, uncomplicated: J45.909

## 2013-01-19 HISTORY — DX: Basal cell carcinoma of skin, unspecified: C44.91

## 2013-01-19 HISTORY — DX: Cardiac murmur, unspecified: R01.1

## 2013-01-19 HISTORY — DX: Cerebral infarction, unspecified: I63.9

## 2013-01-19 HISTORY — DX: Disorder of kidney and ureter, unspecified: N28.9

## 2013-01-19 HISTORY — DX: Unspecified chronic bronchitis: J42

## 2013-01-19 HISTORY — PX: ABDOMINAL AORTAGRAM: SHX5706

## 2013-01-19 HISTORY — DX: Pneumonia, unspecified organism: J18.9

## 2013-01-19 LAB — GLUCOSE, CAPILLARY
Glucose-Capillary: 91 mg/dL (ref 70–99)
Glucose-Capillary: 85 mg/dL (ref 70–99)
Glucose-Capillary: 91 mg/dL (ref 70–99)

## 2013-01-19 LAB — POCT ACTIVATED CLOTTING TIME: Activated Clotting Time: 196 seconds

## 2013-01-19 SURGERY — ABDOMINAL ANGIOGRAM

## 2013-01-19 MED ORDER — CLOPIDOGREL BISULFATE 75 MG PO TABS: 75.0000 mg | ORAL_TABLET | ORAL | Status: DC

## 2013-01-19 MED ORDER — ONDANSETRON HCL 4 MG/2ML IJ SOLN: 4.0000 mg | Freq: Four times a day (QID) | INTRAMUSCULAR | Status: DC | PRN

## 2013-01-19 MED ORDER — AMLODIPINE BESYLATE 10 MG PO TABS
10.0000 mg | ORAL_TABLET | Freq: Every evening | ORAL | Status: DC
  Administered 2013-01-19: 18:00:00 10 mg via ORAL
  Filled 2013-01-19 (×2): qty 1

## 2013-01-19 MED ORDER — HYDRALAZINE HCL 25 MG PO TABS
25.0000 mg | ORAL_TABLET | Freq: Two times a day (BID) | ORAL | Status: DC
  Administered 2013-01-19 – 2013-01-20 (×2): 25 mg via ORAL
  Filled 2013-01-19 (×3): qty 1

## 2013-01-19 MED ORDER — NITROGLYCERIN 0.4 MG SL SUBL: 0.4000 mg | SUBLINGUAL_TABLET | SUBLINGUAL | Status: DC | PRN

## 2013-01-19 MED ORDER — POTASSIUM CHLORIDE CRYS ER 20 MEQ PO TBCR
20.0000 meq | EXTENDED_RELEASE_TABLET | Freq: Every day | ORAL | Status: DC
  Administered 2013-01-19 – 2013-01-20 (×2): 20 meq via ORAL
  Filled 2013-01-19 (×2): qty 1

## 2013-01-19 MED ORDER — HYDRALAZINE HCL 20 MG/ML IJ SOLN: 10.0000 mg | Freq: Once | INTRAMUSCULAR | Status: DC

## 2013-01-19 MED ORDER — FENTANYL CITRATE 0.05 MG/ML IJ SOLN
INTRAMUSCULAR | Status: AC
  Filled 2013-01-19: qty 2

## 2013-01-19 MED ORDER — ASPIRIN 81 MG PO CHEW
324.0000 mg | CHEWABLE_TABLET | ORAL | Status: AC
  Administered 2013-01-19: 324 mg via ORAL

## 2013-01-19 MED ORDER — LIDOCAINE HCL (PF) 1 % IJ SOLN
INTRAMUSCULAR | Status: AC
  Filled 2013-01-19: qty 30

## 2013-01-19 MED ORDER — IRBESARTAN 300 MG PO TABS
300.0000 mg | ORAL_TABLET | Freq: Every day | ORAL | Status: DC
  Administered 2013-01-20: 300 mg via ORAL
  Filled 2013-01-19 (×2): qty 1

## 2013-01-19 MED ORDER — MORPHINE SULFATE 2 MG/ML IJ SOLN
INTRAMUSCULAR | Status: AC
  Filled 2013-01-19: qty 1

## 2013-01-19 MED ORDER — VALSARTAN-HYDROCHLOROTHIAZIDE 320-25 MG PO TABS
1.0000 | ORAL_TABLET | Freq: Every day | ORAL | Status: DC
Start: 2013-01-19 — End: 2013-01-19

## 2013-01-19 MED ORDER — CLOPIDOGREL BISULFATE 75 MG PO TABS
75.0000 mg | ORAL_TABLET | Freq: Every day | ORAL | Status: DC
  Administered 2013-01-20: 11:00:00 75 mg via ORAL
  Filled 2013-01-19: qty 1

## 2013-01-19 MED ORDER — ASPIRIN 81 MG PO TBEC
81.0000 mg | DELAYED_RELEASE_TABLET | Freq: Every day | ORAL | Status: DC
Start: 2013-01-19 — End: 2013-01-19

## 2013-01-19 MED ORDER — HEPARIN SODIUM (PORCINE) 1000 UNIT/ML IJ SOLN
INTRAMUSCULAR | Status: AC
  Filled 2013-01-19: qty 1

## 2013-01-19 MED ORDER — SODIUM CHLORIDE 0.9 % IJ SOLN: 3.0000 mL | INTRAMUSCULAR | Status: DC | PRN

## 2013-01-19 MED ORDER — HEPARIN (PORCINE) IN NACL 2-0.9 UNIT/ML-% IJ SOLN
INTRAMUSCULAR | Status: AC
  Filled 2013-01-19: qty 1000

## 2013-01-19 MED ORDER — MORPHINE SULFATE 2 MG/ML IJ SOLN
2.0000 mg | INTRAMUSCULAR | Status: DC | PRN
  Administered 2013-01-19: 17:00:00 2 mg via INTRAVENOUS

## 2013-01-19 MED ORDER — SODIUM CHLORIDE 0.9 % IV SOLN: INTRAVENOUS | Status: AC

## 2013-01-19 MED ORDER — ACETAMINOPHEN 325 MG PO TABS: 650.0000 mg | ORAL_TABLET | ORAL | Status: DC | PRN

## 2013-01-19 MED ORDER — ASPIRIN EC 325 MG PO TBEC
325.0000 mg | DELAYED_RELEASE_TABLET | Freq: Every day | ORAL | Status: DC
  Administered 2013-01-20: 10:00:00 325 mg via ORAL
  Filled 2013-01-19: qty 1

## 2013-01-19 MED ORDER — HYDROCHLOROTHIAZIDE 25 MG PO TABS
25.0000 mg | ORAL_TABLET | Freq: Every day | ORAL | Status: DC
  Administered 2013-01-20: 10:00:00 25 mg via ORAL
  Filled 2013-01-19 (×2): qty 1

## 2013-01-19 MED ORDER — HYDROCODONE-ACETAMINOPHEN 5-325 MG PO TABS
1.0000 | ORAL_TABLET | ORAL | Status: DC | PRN
  Administered 2013-01-19 – 2013-01-20 (×3): 1 via ORAL
  Filled 2013-01-19 (×3): qty 1

## 2013-01-19 MED ORDER — ASPIRIN 81 MG PO CHEW
CHEWABLE_TABLET | ORAL | Status: AC
  Administered 2013-01-19: 324 mg via ORAL
  Filled 2013-01-19: qty 4

## 2013-01-19 MED ORDER — ATORVASTATIN CALCIUM 80 MG PO TABS
80.0000 mg | ORAL_TABLET | Freq: Every morning | ORAL | Status: DC
  Administered 2013-01-20: 10:00:00 80 mg via ORAL
  Filled 2013-01-19: qty 1

## 2013-01-19 MED ORDER — MIDAZOLAM HCL 2 MG/2ML IJ SOLN
INTRAMUSCULAR | Status: AC
  Filled 2013-01-19: qty 2

## 2013-01-19 MED ORDER — SODIUM CHLORIDE 0.9 % IV SOLN
INTRAVENOUS | Status: DC
  Administered 2013-01-19: 10:00:00 via INTRAVENOUS

## 2013-01-19 MED ORDER — ALPRAZOLAM 0.25 MG PO TABS
0.5000 mg | ORAL_TABLET | Freq: Every evening | ORAL | Status: DC
  Administered 2013-01-19: 0.5 mg via ORAL
  Filled 2013-01-19: qty 2

## 2013-01-19 MED ORDER — FOLIC ACID 1 MG PO TABS
1.0000 mg | ORAL_TABLET | Freq: Every day | ORAL | Status: DC
  Administered 2013-01-19 – 2013-01-20 (×2): 1 mg via ORAL
  Filled 2013-01-19 (×3): qty 1

## 2013-01-19 MED ORDER — ALBUTEROL SULFATE HFA 108 (90 BASE) MCG/ACT IN AERS
2.0000 | INHALATION_SPRAY | Freq: Four times a day (QID) | RESPIRATORY_TRACT | Status: DC | PRN
  Filled 2013-01-19: qty 6.7

## 2013-01-19 NOTE — H&P (Signed)
   Pt was reexamined and existing H & P reviewed. No changes found.  [name], MD [facility] 01/19/2013 11:24 AM

## 2013-01-19 NOTE — Plan of Care (Signed)
Problem: Phase II Progression Outcomes Goal: Other Discharge Outcomes/Goals Outcome: Progressing Pt eats with good appetite and admits to eating very high calorie snacks to try to put on weight without success.  He states he lost 60 pounds in past year.  Pt states his blood sugar runs 80-100 at home and is not on any diabetic meds.  Pt quit smoking 08/2012 using electronic cigarette.  Congratulated pt and encouragement given.  Note left for Dr [name] on chart informing him of reported weight loss.

## 2013-01-19 NOTE — CV Procedure (Signed)
Max [name] is a 57 y.o. male    [id number] LOCATION:  FACILITY: [facility]  PHYSICIAN: Nanetta Batty, M.D. 04-19-1956   DATE OF PROCEDURE:  01/19/2013  DATE OF DISCHARGE:   CARDIAC CATHETERIZATION     History obtained from chart review.57 year old Caucasian male patient of Dr. 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 He has had iliac stents performed remotely at [facility]. In addition he has hypertension and hyperlipidemia. His hypertension is labile with complications of headache and presyncope. He is on multiple antihypertensive medications. Renal Doppler suggested a small right kidney with preocclusive flow of motion the Doppler suggested high-grade disease in his right common iliac artery ABI 0.7 range bilaterally. He presented now for angiography and potential percutaneous recatheterization of his right renal artery and right leg.I angiogram him at 12/07/12 revealing an occluded right renal artery, occluded left iliac artery, and high-grade right iliac "in-stent restenosis". I did up restenting his right common iliac artery with an eye cast covered stent (7/38 mm). This right lower extremity claudication Improved significantly now to the point where he is more symptomatic on the left.    PROCEDURE DESCRIPTION:    The patient was brought to the second floor [facility] cath lab in the postabsorptive state. He was premedicated with Valium 5 mg by mouth, IV Versed and fentanyl. His right and left groinsWere prepped and shaved in usual sterile fashion. Xylocaine 1% was used for local anesthesia. A 5 French sheath was inserted into the right common femoral  artery using standard Seldinger technique. A 7 French Bright tip sheath was inserted into the left common femoral artery.The patient received  5000 units  of heparin  intravenously.  The ending ACT was 200. The patient received a total of 99 cc of  contrast.    HEMODYNAMICS:    AO SYSTOLIC/AO DIASTOLIC: 148/70   ANGIOGRAPHIC RESULTS/ procedure description  The abdominal aortogram was performed with a 5 French pigtail catheter noting the proximal distal. This was done through a the right common femoral artery sheath. Attempts were made to cross the "CTO" with the Viance crossing catheter and an 014 [place] core wire. This entered a subintimal plane. I then tried to cross the CTO with an 0359 Navicross endhole catheter and a Glidewire advantage. I was unable to stay in the lumen. I attempted with a SOS catheter to enter the proximal Unsuccessfully as well.   IMPRESSION:unsuccessful attempt at crossing a moderately long segment calcified proximal left common iliac artery CTO for life style limiting claudication. The sheaths will be removed once the ACT falls below 170 and pressure will be held on the groin to achieve hemostasis. The patient left the Cath Lab in stable condition. He'll be hydrated overnight, discharged home in the morning. Consideration will be given to elective right-to-left fem-fem crossover graft.  [name], [facility] 01/19/2013 12:33 PM

## 2013-01-19 NOTE — Progress Notes (Signed)
Patient reports that he has not been trying to lose weight but has lost 60 pounds in the last year.

## 2013-01-19 NOTE — Plan of Care (Signed)
Problem: Phase II Progression Outcomes Goal: Activity appropriate for discharge plan Outcome: Completed/Met Date Met:  01/19/13 Pt walks in hall frequently, states it helps his back pain.  Denies leg pain with ambulation, no sob or cp.

## 2013-01-20 ENCOUNTER — Ambulatory Visit (HOSPITAL_COMMUNITY): Payer: Federal, State, Local not specified - PPO

## 2013-01-20 ENCOUNTER — Other Ambulatory Visit: Payer: Self-pay | Admitting: Cardiology

## 2013-01-20 DIAGNOSIS — Z01818 Encounter for other preprocedural examination: Secondary | ICD-10-CM

## 2013-01-20 DIAGNOSIS — I701 Atherosclerosis of renal artery: Secondary | ICD-10-CM

## 2013-01-20 DIAGNOSIS — I10 Essential (primary) hypertension: Secondary | ICD-10-CM

## 2013-01-20 DIAGNOSIS — R001 Bradycardia, unspecified: Secondary | ICD-10-CM

## 2013-01-20 DIAGNOSIS — I739 Peripheral vascular disease, unspecified: Secondary | ICD-10-CM

## 2013-01-20 DIAGNOSIS — I70219 Atherosclerosis of native arteries of extremities with intermittent claudication, unspecified extremity: Secondary | ICD-10-CM

## 2013-01-20 DIAGNOSIS — R55 Syncope and collapse: Secondary | ICD-10-CM

## 2013-01-20 LAB — BASIC METABOLIC PANEL
BUN: 17 mg/dL (ref 6–23)
Chloride: 103 mEq/L (ref 96–112)
GFR calc Af Amer: >90 mL/min (ref >90)
Potassium: 3.7 mEq/L (ref 3.5–5.1)
Sodium: 138 mEq/L (ref 135–145)

## 2013-01-20 LAB — HEPATIC FUNCTION PANEL
ALT: 12 U/L (ref 0–53)
Alkaline Phosphatase: 60 U/L (ref 39–117)
Bilirubin, Direct: <0.1 mg/dL (ref 0.0–0.3)
Total Bilirubin: 0.2 mg/dL — ABNORMAL LOW (ref 0.3–1.2)

## 2013-01-20 LAB — CBC
HCT: 39.5 % (ref 39.0–52.0)
Hemoglobin: 13.5 g/dL (ref 13.0–17.0)
RBC: 4.59 MIL/uL (ref 4.22–5.81)
WBC: 11.2 10*3/uL — ABNORMAL HIGH (ref 4.0–10.5)

## 2013-01-20 NOTE — Discharge Summary (Signed)
Physician Discharge Summary      Patient ID: Max [name] MRN: [id number] DOB/AGE: 57/28/1957 57 y.o.  Admit date: 01/19/2013 Discharge date: 01/20/2013  Discharge Diagnoses:  Principal Problem:   Atherosclerotic peripheral vascular disease with intermittent claudication, 01/19/13 unsuccessful attempt crossing a mod. long segment prox lt common iliac artery Active Problems:   CAD, MI- RCA stent '01. Low risk Myoview 11/13-   Tobacco abuse - complicated by CAD and PAD; over 100 days without a cigarette   PAD - sp multiple L & R LE PTA-stenting. RCIA stent 12/07/12   Labile hypertension - with significant blood pressure shifts in response to any antihypertensive. Leads to near syncope and significant fatigue   Cardiomyopathy, ischemic- EF 45% 11/13   Discharged Condition: good  Procedures: 01/19/13:   PV angiogram and attempted but unsuccessful attempt at crossing a moderately long segment calcified proximal left common iliac artery CTO for life style limiting claudication.   Hospital Course:  57 year old Caucasian male patient of Dr. 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 He has had iliac stents performed remotely at [facility]. In addition he has hypertension and hyperlipidemia. His hypertension is labile with complications of headache and presyncope. He is on multiple antihypertensive medications. Renal Doppler suggested a small right kidney with preocclusive flow of motion the Doppler suggested high-grade disease in his right common iliac artery ABI 0.7 range bilaterally. He presented now for angiography and potential percutaneous recatheterization of his right renal artery and right leg.I angiogram him at 12/07/12 revealing an occluded right renal artery, occluded left iliac artery, and high-grade right iliac "in-stent restenosis". I did up restenting his right common iliac artery with an eye cast covered  stent (7/38 mm). This right lower extremity claudication Improved significantly now to the point where he is more symptomatic on the left.  Pt underwent PV angio and unsuccessful attempt at crossing a moderately long segment calcified proximal left common iliac artery CTO for life style limiting claudication.    Will ultimately require Right to Left Fem-Fem cross over graft. Also needs Renal eval for ? Goldblatt Kidney and labile HTN.  He is being scheduled for renal appt.  Pt also with possibly 60 pound weight loss this year.  CXR at discharge with COPD, albumin was normal.  PSA and CEA Pending.  Pt instructed to see PCP for wt. Loss.  He was bradycardic in hospital and felt like he does with near syncope.  Have ordered 2 week of event monitor.  His BP with orthostatic VS without decrease.     Consults: None  Significant Diagnostic Studies:  BMET    Component Value Date/Time   NA 138 01/20/2013 0520   K 3.7 01/20/2013 0520   CL 103 01/20/2013 0520   CO2 28 01/20/2013 0520   GLUCOSE 99 01/20/2013 0520   BUN 17 01/20/2013 0520   CREATININE 1.00 01/20/2013 0520   CREATININE 1.14 01/18/2013 1205   CALCIUM 9.2 01/20/2013 0520   GFRNONAA 82* 01/20/2013 0520   GFRAA >90 01/20/2013 0520    CBC    Component Value Date/Time   WBC 11.2* 01/20/2013 0520   RBC 4.59 01/20/2013 0520   HGB 13.5 01/20/2013 0520   HCT 39.5 01/20/2013 0520   PLT 135* 01/20/2013 0520   MCV 86.1 01/20/2013 0520   MCH 29.4 01/20/2013 0520   MCHC 34.2 01/20/2013 0520   RDW 13.4 01/20/2013 0520       Discharge Exam: Blood pressure  169/98, pulse 64, temperature 97.6 F (36.4 C), temperature source Oral, resp. rate 18, height 5\' 8"  (1.727 m), weight 129 lb 6.6 oz (58.7 kg), SpO2 99.00%. AM exam: PE: General:Pleasant affect, NAD  Skin:Warm and dry, brisk capillary refill  HEENT:normocephalic, sclera clear, mucus membranes moist  Heart:S1S2 RRR without murmur, gallup, rub or click  Lungs:clear without rales, rhonchi, or wheezes    ZOX:WRUE, non tender, + BS, do not palpate liver spleen or masses  Ext:no lower ext edema, cath sites without hematoma  Neuro:alert and oriented, MAE, follows commands, + facial symmetry  Disposition: 01-Home or Self Care   Future Appointments Provider Department Dept Phone   01/20/2013 3:30 PM Sehvg-Sehvg Nurse [facility] [place] [phone]   02/04/2013 11:45 AM [name], MD [facility] [place] [phone]       Medication List         albuterol 108 (90 BASE) MCG/ACT inhaler  Commonly known as:  PROVENTIL HFA;VENTOLIN HFA  Inhale 2 puffs into the lungs every 6 (six) hours as needed for wheezing or shortness of breath.     ALPRAZolam 0.5 MG tablet  Commonly known as:  XANAX  Take 0.5 mg by mouth every evening. Take 1 tablet in morning and 2 tablet at bedtime ( Per pt 1/2 tablet at bedtime only)     amLODipine 10 MG tablet  Commonly known as:  NORVASC  Take 10 mg by mouth every evening.     aspirin 81 MG EC tablet  Take 81 mg by mouth at bedtime.     atorvastatin 80 MG tablet  Commonly known as:  LIPITOR  Take 80 mg by mouth every morning.     clopidogrel 75 MG tablet  Commonly known as:  PLAVIX  Take 75 mg by mouth every morning.     folic acid 1 MG tablet  Commonly known as:  FOLVITE  Take 1 mg by mouth daily. Take 2 tablet in the morning     hydrALAZINE 25 MG tablet  Commonly known as:  APRESOLINE  Take 1 tablet by mouth 2 (two) times daily.     HYDROcodone-acetaminophen 5-325 MG per tablet  Commonly known as:  NORCO/VICODIN  Take 1 tablet by mouth every 4 (four) hours as needed for pain.     KLOR-CON M10 10 MEQ tablet  Generic drug:  potassium chloride  Take 2 tablets by mouth daily.     nitroGLYCERIN 0.4 MG SL tablet  Commonly known as:  NITROSTAT  Place 1 tablet (0.4 mg total) under the tongue every 5 (five) minutes as needed for chest pain (take for severe chest pressure ot  tightness).     valsartan-hydrochlorothiazide 320-25 MG per tablet  Commonly known as:  DIOVAN-HCT  Take 1 tablet by mouth daily.           Follow-up Information   Follow up with [name], MD On 02/04/2013. (at 11:45 am)    Specialty:  Cardiology   Contact information:   [address] [place] [address] [phone]     Discharge Instructions: Heart healthy diet.  We will have you wear a monitor for 1 month to evaluate bradycardia here.  Go by our office when you leave and have it placed.   Call [facility] if any bleeding, swelling or drainage at cath site.  May shower, no tub baths for 48 hours for groin sticks.    No lifting over 5  pounds for 3 days, no driving for 2 days.  No work until Tuesday 01/26/13.  For your weight loss, write down everything you eat for 1 week- a log, then see your primary MD.  We are checking labs and looking at your CXR.  But PCP is best place to follow wt. Loss.      Signed: Leone Brand Nurse Practitioner-Certified [facility] 01/20/2013, 11:58 AM  Time spent on discharge : 40 minutes.

## 2013-01-20 NOTE — Progress Notes (Signed)
Subjective: Very upset about his BP and HR, he has near syncope/dizziness freq after his meds.  Same happened yesterday.  Objective: Vital signs in last 24 hours: Temp:  [97.5 F (36.4 C)-98.2 F (36.8 C)] 98.2 F (36.8 C) (08/27 0337) Pulse Rate:  [47-81] 81 (08/27 0337) Resp:  [17-20] 20 (08/27 0337) BP: (131-199)/(61-97) 171/85 mmHg (08/27 0337) SpO2:  [98 %-100 %] 98 % (08/27 0103) Weight:  [128 lb (58.06 kg)-129 lb 6.6 oz (58.7 kg)] 129 lb 6.6 oz (58.7 kg) (08/27 0103) Weight change:  Last BM Date: 01/18/13 Intake/Output from previous day: +1141 08/26 0701 - 08/27 0700 In: 1691.3 [P.O.:1080; I.V.:611.3] Out: 550 [Urine:550] Intake/Output this shift:    PE: General:Pleasant affect, NAD Skin:Warm and dry, brisk capillary refill HEENT:normocephalic, sclera clear, mucus membranes moist Heart:S1S2 RRR without murmur, gallup, rub or click Lungs:clear without rales, rhonchi, or wheezes ZOX:WRUE, non tender, + BS, do not palpate liver spleen or masses Ext:no lower ext edema, cath sites without hematoma Neuro:alert and oriented, MAE, follows commands, + facial symmetry   Lab Results:  Recent Labs  01/18/13 1205 01/20/13 0520  WBC 8.6 11.2*  HGB 15.9 13.5  HCT 45.2 39.5  PLT 176 135*   BMET  Recent Labs  01/18/13 1205 01/20/13 0520  NA 138 138  K 3.6 3.7  CL 106 103  CO2 27 28  GLUCOSE 88 99  BUN 16 17  CREATININE 1.14 1.00  CALCIUM 9.6 9.2   No results found for this basename: TROPONINI, CK, MB,  in the last 72 hours  Lab Results  Component Value Date   CHOL 149 04/08/2012   HDL 31* 04/08/2012   LDLCALC 90 04/08/2012   TRIG 141 04/08/2012   CHOLHDL 4.8 04/08/2012   Lab Results  Component Value Date   HGBA1C 6.5* 04/07/2012     Lab Results  Component Value Date   TSH 1.482 12/01/2012    Studies/Results: 01/19/13: Unsuccessful attempt at crossing a moderately long segment calcified proximal left common iliac artery CTO for life style  limiting claudication.    Medications: I have reviewed the patient's current medications. Scheduled Meds: . ALPRAZolam  0.5 mg Oral QPM  . amLODipine  10 mg Oral QPM  . aspirin EC  325 mg Oral Daily  . atorvastatin  80 mg Oral q morning - 10a  . clopidogrel  75 mg Oral Q breakfast  . folic acid  1 mg Oral Daily  . hydrALAZINE  10 mg Intravenous Once  . hydrALAZINE  25 mg Oral BID  . irbesartan  300 mg Oral Daily   And  . hydrochlorothiazide  25 mg Oral Daily  . potassium chloride  20 mEq Oral Daily   Continuous Infusions:  PRN Meds:.acetaminophen, albuterol, HYDROcodone-acetaminophen, morphine injection, nitroGLYCERIN, ondansetron (ZOFRAN) IV  Assessment/Plan: Principal Problem:   Atherosclerotic peripheral vascular disease with intermittent claudication, 01/19/13 unsuccessful attempt crossing a mod. long segment prox lt common iliac artery Active Problems:   CAD, MI- RCA stent '01. Low risk Myoview 11/13-   Tobacco abuse - complicated by CAD and PAD; over 100 days without a cigarette   PAD - sp multiple L & R LE PTA-stenting. RCIA stent 12/07/12   Labile hypertension - with significant blood pressure shifts in response to any antihypertensive. Leads to near syncope and significant fatigue   Cardiomyopathy, ischemic- EF 45% 11/13  PLAN: ambulate, discharge home follow up with Dr [name] to discuss further plan.   BP elevated this AM.pt  very upset about BP and HR.[name] HR off of BB for months continues to drop into 40s, yesterday BP did not drop but his HR did, this is symptomatic with dizziness and occ nausea.  Will wear monitor to further eval.   Wt loss continues to loose wt. Has not seen primary MD.  Draw Hepatic panel today.  Nutrition to see.  LOS: 1 day    [facility] R  Nurse Practitioner Certified Pager [phone] 01/20/2013, 8:15 AM   Agree with note written by Nada Boozer RNP  Failed attempt at recanalization LCIA CTO. Groins ok. D/C home. Will ultimately reqiore Right to  Left Fem-Fem cross over graft. Also needs Renal eval for ? Goldblatt Kidney and labile HTN   Maikayla Beggs J 01/20/2013 9:29 AM

## 2013-02-04 ENCOUNTER — Encounter: Payer: Self-pay | Admitting: Cardiovascular Disease

## 2013-02-04 ENCOUNTER — Ambulatory Visit (INDEPENDENT_AMBULATORY_CARE_PROVIDER_SITE_OTHER): Payer: Federal, State, Local not specified - PPO | Admitting: Cardiovascular Disease

## 2013-02-04 VITALS — BP 126/80 | HR 74 | Ht 68.0 in | Wt 137.0 lb

## 2013-02-04 DIAGNOSIS — I739 Peripheral vascular disease, unspecified: Secondary | ICD-10-CM

## 2013-02-04 DIAGNOSIS — I701 Atherosclerosis of renal artery: Secondary | ICD-10-CM

## 2013-02-04 NOTE — Assessment & Plan Note (Signed)
I performed peripheral angiography on him on7/14/14 and restented his right common iliac artery with excellent angiographic and clinical result. I did document occlusion of his right renal artery. I brought him back on 01/19/13 attempted to percutaneously revascularize his left common iliac artery chronic total occlusion unsuccessfully both antegrade and retrograde. I believe he'll need a right-to-left fem-fem crossover graft.

## 2013-02-04 NOTE — Progress Notes (Signed)
02/04/2013 Max [name]   December 06, 1955  [id number]  Primary Physician [name], MD Primary Cardiologist: [name] [name]   HPI:  57 year old Caucasian male patient of Dr. 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 He has had iliac stents performed remotely at [facility]. In addition he has hypertension and hyperlipidemia. His hypertension is labile with complications of headache and presyncope. He is on multiple antihypertensive medications. Renal Doppler suggested a small right kidney with preocclusive flow of motion the Doppler suggested high-grade disease in his right common iliac artery ABI 0.7 range bilaterally. He presented now for angiography and potential percutaneous recatheterization of his right renal artery and right leg.I angiogram him at 12/07/12 revealing an occluded right renal artery, occluded left iliac artery, and high-grade right iliac "in-stent restenosis". I did up restenting his right common iliac artery with an eye cast covered stent (7/38 mm). This right lower extremity claudication Improved significantly now to the point where he is more symptomatic on the left. I perform peripheral angiography and intervention on him on 01/19/13 an attempt to percutaneously recanalize his left common iliac artery chronic total occlusion with antegrade and retrograde unsuccessfully. I'm going to refer him to Dr. Durene Cal for consideration of right to left fem-fem crossover grafting.    Current Outpatient Prescriptions  Medication Sig Dispense Refill  . albuterol (PROVENTIL HFA;VENTOLIN HFA) 108 (90 BASE) MCG/ACT inhaler Inhale 2 puffs into the lungs every 6 (six) hours as needed for wheezing or shortness of breath.       . ALPRAZolam (XANAX) 0.5 MG tablet Take 0.5 mg by mouth every evening. Take 1 tablet in morning and 2 tablet at bedtime ( Per pt 1/2 tablet at bedtime only)       . amLODipine (NORVASC) 10 MG tablet Take 10 mg by mouth every evening.       [name] aspirin 81 MG EC tablet Take 81 mg by mouth at bedtime.       [name] atorvastatin (LIPITOR) 80 MG tablet Take 80 mg by mouth every morning.       . clopidogrel (PLAVIX) 75 MG tablet Take 75 mg by mouth every morning.       . folic acid (FOLVITE) 1 MG tablet Take 1 mg by mouth daily. Take 2 tablet in the morning      . hydrALAZINE (APRESOLINE) 25 MG tablet Take 1 tablet by mouth 2 (two) times daily.      [name] HYDROcodone-acetaminophen (NORCO/VICODIN) 5-325 MG per tablet Take 1 tablet by mouth every 4 (four) hours as needed for pain.       [name] KLOR-CON M10 10 MEQ tablet Take 2 tablets by mouth daily.       [name] lisinopril (PRINIVIL,ZESTRIL) 10 MG tablet Take 10 mg by mouth 2 (two) times daily.      . nitroGLYCERIN (NITROSTAT) 0.4 MG SL tablet Place 1 tablet (0.4 mg total) under the tongue every 5 (five) minutes as needed for chest pain (take for severe chest pressure ot tightness).  25 tablet  2  . valsartan-hydrochlorothiazide (DIOVAN-HCT) 320-25 MG per tablet Take 1 tablet by mouth daily.       No current facility-administered medications for this visit.    Allergies  Allergen Reactions  . Carvedilol Other (See Comments)    Per pt, pule rate decreases and he feels like he will pass out.    History   Social History  . Marital Status: Single  Spouse Name: N/A    Number of Children: 0  . Years of Education: N/A   Occupational History  .  [employer]   Social History Main Topics  . Smoking status: Former Smoker -- 1.50 packs/day for 40 years    Types: Cigarettes    Quit date: 07/26/2012  . Smokeless tobacco: Former Neurosurgeon    Types: Chew     Comment: 01/19/2013 "quit chewing 25-30 yr ago"  . Alcohol Use: No  . Drug Use: No  . Sexual Activity: Not Currently   Other Topics Concern  . Not on file   Social History Narrative  . No narrative on file     Review of Systems: General: negative for chills,  fever, night sweats or weight changes.  Cardiovascular: negative for chest pain, dyspnea on exertion, edema, orthopnea, palpitations, paroxysmal nocturnal dyspnea or shortness of breath Dermatological: negative for rash Respiratory: negative for cough or wheezing Urologic: negative for hematuria Abdominal: negative for nausea, vomiting, diarrhea, bright red blood per rectum, melena, or hematemesis Neurologic: negative for visual changes, syncope, or dizziness All other systems reviewed and are otherwise negative except as noted above.    Blood pressure 150/82, pulse 64, height 5\' 8"  (1.727 m), weight 137 lb (62.143 kg).  General appearance: alert and no distress Neck: no adenopathy, no JVD, supple, symmetrical, trachea midline, thyroid not enlarged, symmetric, no tenderness/mass/nodules and soft left carotid bruit Lungs: clear to auscultation bilaterally Heart: regular rate and rhythm, S1, S2 normal, no murmur, click, rub or gallop Extremities: his right and left femoral arterial puncture sites are well-healed  EKG not performed today  ASSESSMENT AND PLAN:   PAD - sp multiple L & R LE PTA-stenting. RCIA stent 12/07/12 I performed peripheral angiography on him on7/14/14 and restented his right common iliac artery with excellent angiographic and clinical result. I did document occlusion of his right renal artery. I brought him back on 01/19/13 attempted to percutaneously revascularize his left common iliac artery chronic total occlusion unsuccessfully both antegrade and retrograde. I believe he'll need a right-to-left fem-fem crossover graft.  Renal artery stenosis I documented occluded renal artery at the time of peripheral angiogram 12/07/12. He has labile hypertension. I've referred him to Dr. Collins [place] in [place] for further dilation treatment of this. I'm wondering whether he has a Dietitian kidney".      [name] FACP,FACC,FAHA, [facility] 02/04/2013 12:05 PM

## 2013-02-04 NOTE — Assessment & Plan Note (Signed)
I documented occluded renal artery at the time of peripheral angiogram 12/07/12. He has labile hypertension. I've referred him to Dr. Collins Scotland in [place] [employer] [place] for further dilation treatment of this. I'm wondering whether he has a Dietitian kidney".

## 2013-02-04 NOTE — Patient Instructions (Addendum)
Your physician wants you to follow-up in: 6 months with Dr [name]. You will receive a reminder letter in the mail two months in advance. If you don't receive a letter, please call our office to schedule the follow-up appointment.   We have referred you to Dr Myra Gianotti for fem fem crossover grafting

## 2013-03-10 ENCOUNTER — Ambulatory Visit (INDEPENDENT_AMBULATORY_CARE_PROVIDER_SITE_OTHER): Payer: Federal, State, Local not specified - PPO | Admitting: Cardiology

## 2013-03-10 ENCOUNTER — Encounter: Payer: Self-pay | Admitting: Cardiology

## 2013-03-10 VITALS — BP 144/80 | HR 51 | Ht 68.0 in | Wt 142.2 lb

## 2013-03-10 DIAGNOSIS — I70219 Atherosclerosis of native arteries of extremities with intermittent claudication, unspecified extremity: Secondary | ICD-10-CM

## 2013-03-10 DIAGNOSIS — I2510 Atherosclerotic heart disease of native coronary artery without angina pectoris: Secondary | ICD-10-CM

## 2013-03-10 DIAGNOSIS — I255 Ischemic cardiomyopathy: Secondary | ICD-10-CM

## 2013-03-10 DIAGNOSIS — Z79899 Other long term (current) drug therapy: Secondary | ICD-10-CM

## 2013-03-10 DIAGNOSIS — I10 Essential (primary) hypertension: Secondary | ICD-10-CM

## 2013-03-10 DIAGNOSIS — I2589 Other forms of chronic ischemic heart disease: Secondary | ICD-10-CM

## 2013-03-10 DIAGNOSIS — Z720 Tobacco use: Secondary | ICD-10-CM

## 2013-03-10 DIAGNOSIS — E785 Hyperlipidemia, unspecified: Secondary | ICD-10-CM

## 2013-03-10 DIAGNOSIS — E782 Mixed hyperlipidemia: Secondary | ICD-10-CM

## 2013-03-10 DIAGNOSIS — I150 Renovascular hypertension: Secondary | ICD-10-CM

## 2013-03-10 DIAGNOSIS — F17200 Nicotine dependence, unspecified, uncomplicated: Secondary | ICD-10-CM

## 2013-03-10 NOTE — Progress Notes (Signed)
PATIENT: Max [name] MRN: [id number]  DOB: 1955-12-22   DOV:03/13/2013 PCP: [name], MD  Clinic Note: Chief Complaint  Patient presents with  . ROV 3 months    C/o dizziness-starts ~1 hour after taking his medications. States he took his meds at 8:30a and is still dizzy.   . Medications    Hasn't taken Hydralazine in 3 days.   HPI: [name] is a 57 y.o. male with a PMH below who presents today for followup of his hypertension and PAD/CAD. Since I last saw him back in July, he I referred him to Dr. [name].  He had several peripheral vascular procedures performed where he had stenting of his right common iliac artery with excellent result.  However he does have occlusion of the left common iliac.  He is recommended right to left fem-fem bypass grafting.  Was referred to Dr. Myra Gianotti he also found an occluded right renal artery.  He was referred to Dr. Casimiro Needle from [facility] for management and treatment of renal vascular hypertension.  Interval History: He comes in today he in general feeling okay.  He still feels tired and fatigued.  He still has some ups and downs as blood pressures.  He just doesn't tolerate his blood pressure going down.  He stopped taking his hydralazine to 0-90 fluctuations.  Dr. Lowell Guitar did not feel that his hypertension warranted a nephrectomy at this time.  He feels like he can adjust his medications as needed.  At this point I think the plan is to allow for permissive hypertension as long as her blood pressure to get too high.  He continues to do well with his electronic cigarettes.  He doesn't have any nicotine anymore.  He simply just using it for flavor.  From a claudication standpoint he is significantly improved and he is scheduled to see Dr. Myra Gianotti on October 20.  The remainder of Cardiovascular ROS: no chest pain or dyspnea on exertion positive for - irregular heartbeat and Headaches and fatigue depending on what his blood  pressures range. negative for - edema, loss of consciousness, murmur, orthopnea, palpitations, paroxysmal nocturnal dyspnea, rapid heart rate or shortness of breath: Additional cardiac review of systems: He does note some lightheadedness and dizziness if his blood pressure gets down to low.  He has not had any of the severely low blood pressures were easily pass out.  He is able to go to work now.  syncope/near-syncope - no; TIA/amaurosis fugax - no Melena - no, hematochezia no; hematuria - no; nosebleeds - no  Past Medical History  Diagnosis Date  . CAD (coronary artery disease), native coronary artery 2001    PCI RCA, low risk Nuc 11/13  . LV dysfunction 03/2012    Echo -  EF 45-50%, mod Conc LVH, mid Inf HK  . Normal cardiac stress test 04/07/2012    Lexiscan myoview; EF 45%, no ischemia or infarct  . Hyperlipidemia 04/07/2012  . PAD (peripheral artery disease) 12/07/12    with stent-bil. iliac, RCIA PTA 12/07/12  . Back pain, chronic     multiple back surgeries   . AAA (abdominal aortic aneurysm)     mild aneurysmal dilatation  . Near syncope 03/2012    Event monitor - PACs, PVCs - no arrhythmia  . Hypertension   . Heart murmur     "outgrew it" (01/19/2013)  . MI (myocardial infarction) 2001    at [facility]; PCI - RCA  . Asthma   .  Pneumonia     "numerous times; last time ~ a year or 2 ago" (01/19/2013)  . Chronic bronchitis     "q couple years or so" (01/19/2013)  . DM (diabetes mellitus) type II controlled peripheral vascular disorder   . Stroke 2014    "little one; little numbness and tingling in left thumb and left facial expressions not the same since" (01/19/2013)  . Basal cell cancer   . Right renal artery stenosis 12/07/12    Rt RAS  . Kidney stone   . Nonfunctioning kidney     "right kidney has atrophied to 1/3 size of the left" (01/19/2013)    Prior Cardiac Evaluation and Past Surgical History: Past Surgical History  Procedure Laterality Date  . Coronary  angioplasty with stent placement  2001    to RCA @ [facility]  . Lumbar laminectomy  1995, 2000,2001    L5-S1  . Iliac artery stent Bilateral     "I have 6 total" (01/19/2013)  . Iliac artery stent Right 12/07/2012    Dr [name]  . Nasal sinus surgery    . Lithotripsy      "twice I think" (01/19/2013)  . Abdominal aortagram  01/19/2013    unsuccessful attempt at crossing a moderately long segment calcified proximal left common iliac artery Hattie Perch 01/19/2013  . Basal cell carcinoma excision      "numerous; all over my face, arms, back" (01/19/2013)   Allergies  Allergen Reactions  . Carvedilol Other (See Comments)    Per pt, pule rate decreases and he feels like he will pass out.    Current Outpatient Prescriptions  Medication Sig Dispense Refill  . albuterol (PROVENTIL HFA;VENTOLIN HFA) 108 (90 BASE) MCG/ACT inhaler Inhale 2 puffs into the lungs every 6 (six) hours as needed for wheezing or shortness of breath.       . ALPRAZolam (XANAX) 0.5 MG tablet Take 0.5 mg by mouth every evening. Take 1 tablet in morning and 2 tablet at bedtime ( Per pt 1/2 tablet at bedtime only)      . amLODipine (NORVASC) 10 MG tablet Take 10 mg by mouth every evening.       [name] aspirin 81 MG EC tablet Take 81 mg by mouth at bedtime.       [name] atorvastatin (LIPITOR) 80 MG tablet Take 80 mg by mouth every morning.       . clopidogrel (PLAVIX) 75 MG tablet Take 75 mg by mouth every morning.       . folic acid (FOLVITE) 1 MG tablet Take 1 mg by mouth daily. Take 2 tablet in the morning      . HYDROcodone-acetaminophen (NORCO/VICODIN) 5-325 MG per tablet Take 1 tablet by mouth every 4 (four) hours as needed for pain.       [name] KLOR-CON M10 10 MEQ tablet Take 2 tablets by mouth daily.       [name] lisinopril (PRINIVIL,ZESTRIL) 10 MG tablet Take 10 mg by mouth 2 (two) times daily.      . nitroGLYCERIN (NITROSTAT) 0.4 MG SL tablet Place 1 tablet (0.4 mg total) under the tongue every 5 (five) minutes as needed for chest pain (take for  severe chest pressure ot tightness).  25 tablet  2  . valsartan-hydrochlorothiazide (DIOVAN-HCT) 320-25 MG per tablet Take 1 tablet by mouth daily.      . hydrALAZINE (APRESOLINE) 25 MG tablet Take 1 tablet by mouth 2 (two) times daily.       No current facility-administered medications  for this visit.    History   Social History Narrative   He is a single gentleman. He swims several days a week.  He is now almost a year out from IV sedation were no longer using nicotine in his electronic cigarette.   Occasional alcohol beverage.   He works as a Curator.   ROS: A comprehensive Review of Systems - Negative except Symptoms noted above. General: negative for chills, fever, night sweats - finally his weight is stabilized and is getting loaded back.  He's had a full workup for cancer that has come back negative. Cardiovascular: negative for chest pain, dyspnea on exertion, edema, orthopnea, palpitations, paroxysmal nocturnal dyspnea or shortness of breath  Dermatological: negative for rash  Respiratory: negative for cough or wheezing  Urologic: negative for hematuria  Abdominal: negative for nausea, vomiting, diarrhea, bright red blood per rectum, melena, or hematemesis  Neurologic: negative for visual changes, syncope, or dizziness  All other systems reviewed and are otherwise negative except as noted above.  PHYSICAL EXAM BP 144/80  Pulse 51  Ht 5\' 8"  (1.727 m)  Wt 142 lb 3.2 oz (64.501 kg)  BMI 21.63 kg/m2 General appearance: alert and no distress; well nourished but somewhat disheveled in appearance. Neck: no adenopathy, no carotid bruit, no JVD, supple, symmetrical, trachea midline and thyroid not enlarged, symmetric, no tenderness/mass/nodules  Lungs: clear to auscultation bilaterally  Heart: regular rate and rhythm, S1, S2 normal, no murmur, click, rub or gallop  Abdomen: Soft/NT/ND./NABS Extremities: extremities normal, atraumatic, no cyanosis or edema  [id number] today:  Yes Rate: 51 , Rhythm: Sinus bradycardia with inferior/lateral MI, age undetermined, LVH; no significant changes.    Recent Labs: Reviewed in Epic.  ASSESSMENT / PLAN: CAD, MI- RCA stent '01. Low risk Myoview 11/13- So far today he had a negative Myoview and the relatively normal echo within the last year.  He doesn't have any active symptoms of angina. He is on aspirin, Plavix, statin.  He is not on a beta blocker because of intolerance in the past.  He is on ACE inhibitor plus ARB for blood pressure control; he is also on amlodipine for additional blood pressure and anginal control.  I would not think that he needs another evaluation perioperatively for fem-fem bypass surgery.  Cardiomyopathy, ischemic- EF 45% 11/13 He does have mild to moderately reduced ejection fraction but no heart failure symptoms.  He has not required any Lasix, but he is on  HCTZ component of Diovan/HCTZ.  Atherosclerotic peripheral vascular disease with intermittent claudication, 01/19/13 unsuccessful attempt crossing a mod. long segment prox lt common iliac artery Plan is referral to Dr. Myra Gianotti.  Will be seen on October 20.  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  Is not clear whether or not his right kidney is actually still producing Renin.  He is being followed by Dr. Lowell Guitar.  He is on an aggressive regimen.  We are probably allow some permissive hypertension, as he really does not tolerate systolic blood pressures below 130 mmHg.  I will defer his blood pressure control to Dr. Lowell Guitar.  Hyperlipidemia He is on statin.  I have not seen any labs since last year.  His LDL was not quite at goal his HDL was low at last check.  This was in November of 2013.  Plan: Continue statin.  Check lipid panel with CMP.  Tobacco abuse - complicated by CAD and PAD; over 100 days without a cigarette He is doing wonderful  with smoking cessation.  I congratulated him on his  perseverance.    Orders Placed This Encounter  Procedures  . Lipid panel    Order Specific Question:  Has the patient fasted?    Answer:  Yes  . Comprehensive metabolic panel    Order Specific Question:  Has the patient fasted?    Answer:  Yes  . EKG 12-Lead   No orders of the defined types were placed in this encounter.    Followup: 6 months  Kateria Cutrona W, M.D., M.S. [facility] [address] [place], [place]  [address]  [phone] Pager # [phone] 03/13/2013 12:00 AM

## 2013-03-10 NOTE — Patient Instructions (Signed)
Labs lipid ;CMP  Your physician wants you to follow-up in 6 MONTHS Dr  [name].  You will receive a reminder letter in the mail two months in advance. If you don't receive a letter, please call our office to schedule the follow-up appointment.

## 2013-03-12 ENCOUNTER — Encounter: Payer: Self-pay | Admitting: Cardiology

## 2013-03-12 ENCOUNTER — Encounter: Payer: Self-pay | Admitting: Surgery

## 2013-03-12 DIAGNOSIS — I150 Renovascular hypertension: Secondary | ICD-10-CM | POA: Insufficient documentation

## 2013-03-12 NOTE — Assessment & Plan Note (Signed)
Plan is referral to Dr. Myra Gianotti.  Will be seen on October 20.

## 2013-03-12 NOTE — Assessment & Plan Note (Signed)
He is doing wonderful with smoking cessation.  I congratulated him on his perseverance.

## 2013-03-12 NOTE — Assessment & Plan Note (Signed)
He is on statin.  I have not seen any labs since last year.  His LDL was not quite at goal his HDL was low at last check.  This was in November of 2013.  Plan: Continue statin.  Check lipid panel with CMP.

## 2013-03-12 NOTE — Assessment & Plan Note (Signed)
So far today he had a negative Myoview and the relatively normal echo within the last year.  He doesn't have any active symptoms of angina. He is on aspirin, Plavix, statin.  He is not on a beta blocker because of intolerance in the past.  He is on ACE inhibitor plus ARB for blood pressure control; he is also on amlodipine for additional blood pressure and anginal control.  I would not think that he needs another evaluation perioperatively for fem-fem bypass surgery.

## 2013-03-12 NOTE — Assessment & Plan Note (Signed)
He had an occluded right renal artery.  Is not clear whether or not his right kidney is actually still producing Renin.  He is being followed by Dr. Lowell Guitar.  He is on an aggressive regimen.  We are probably allow some permissive hypertension, as he really does not tolerate systolic blood pressures below 130 mmHg.  I will defer his blood pressure control to Dr. Lowell Guitar.

## 2013-03-12 NOTE — Assessment & Plan Note (Signed)
He does have mild to moderately reduced ejection fraction but no heart failure symptoms.  He has not required any Lasix, but he is on  HCTZ component of Diovan/HCTZ.

## 2013-03-15 ENCOUNTER — Ambulatory Visit (INDEPENDENT_AMBULATORY_CARE_PROVIDER_SITE_OTHER): Payer: Federal, State, Local not specified - PPO | Admitting: Surgery

## 2013-03-15 ENCOUNTER — Encounter: Payer: Self-pay | Admitting: Surgery

## 2013-03-15 VITALS — BP 141/87 | HR 100 | Resp 16 | Ht 68.0 in | Wt 140.0 lb

## 2013-03-15 DIAGNOSIS — I714 Abdominal aortic aneurysm, without rupture, unspecified: Secondary | ICD-10-CM

## 2013-03-15 DIAGNOSIS — I739 Peripheral vascular disease, unspecified: Secondary | ICD-10-CM

## 2013-03-15 NOTE — Progress Notes (Signed)
[facility] [place]   Patient name: Max [name] MRN: [id number] DOB: 10-07-1955 Sex: male   Referred by: dr. [name]  Reason for referral:  Chief Complaint  Patient presents with  . PVD    leg leg pain referred by Dr Erlene Quan    HISTORY OF PRESENT ILLNESS: This is a very pleasant 57 year old gentleman that I had seen for evaluation of peripheral vascular disease.  The patient states that back around 2000, he underwent bilateral iliac stenting in [place].  He has had multiple stents placed in bilateral iliacs.  Most recently he was studied by Dr. [name] in August.  At that time his left iliac stent was found to be occluded and could not be recanalized.  In stent stenosis was identified within the right common iliac stent, and this was treated with a ICAST stents.  The patient reports improvement in his symptoms.  He does state that his left leg will exhibit signs of weakness and cramping at 25-50 yards.  He states that he is able to tolerate this because his activity is limited by back pain.  He has a history of L5-S1 back surgery x3.  The patient reports approximately 60-70 pound weight loss over the course of the past year.  He is currently undergoing a malignancy workup by his primary care physician.  The patient is a diabetic.  This has been well controlled as of late.  He is not on any medications currently.  He is medically managed for his hypercholesterolemia with a statin.  He takes an ACE inhibitor for his hypertension.  In 2013, he had a negative Myoview.  He has a known small abdominal aortic aneurysm  Past Medical History  Diagnosis Date  . CAD (coronary artery disease), native coronary artery 2001    PCI RCA, low risk Nuc 11/13  . LV dysfunction 03/2012    Echo -  EF 45-50%, mod Conc LVH, mid Inf HK  . Normal cardiac stress test 04/07/2012    Lexiscan myoview; EF 45%, no ischemia or infarct  . Hyperlipidemia 04/07/2012  . PAD (peripheral artery  disease) 12/07/12    with stent-bil. iliac, RCIA PTA 12/07/12  . Back pain, chronic     multiple back surgeries   . AAA (abdominal aortic aneurysm)     mild aneurysmal dilatation  . Near syncope 03/2012    Event monitor - PACs, PVCs - no arrhythmia  . Hypertension   . Heart murmur     "outgrew it" (01/19/2013)  . MI (myocardial infarction) 2001    at [facility]; PCI - RCA  . Asthma   . Pneumonia     "numerous times; last time ~ a year or 2 ago" (01/19/2013)  . Chronic bronchitis     "q couple years or so" (01/19/2013)  . DM (diabetes mellitus) type II controlled peripheral vascular disorder   . Stroke 2014    "little one; little numbness and tingling in left thumb and left facial expressions not the same since" (01/19/2013)  . Basal cell cancer   . Right renal artery stenosis 12/07/12    Rt RAS  . Kidney stone   . Nonfunctioning kidney     "right kidney has atrophied to 1/3 size of the left" (01/19/2013)    Past Surgical History  Procedure Laterality Date  . Coronary angioplasty with stent placement  2001    to RCA @ [facility]  . Lumbar laminectomy  1995, 2000,2001    L5-S1  .  Iliac artery stent Bilateral     "I have 6 total" (01/19/2013)  . Iliac artery stent Right 12/07/2012    Dr [name]  . Nasal sinus surgery    . Lithotripsy      "twice I think" (01/19/2013)  . Abdominal aortagram  01/19/2013    unsuccessful attempt at crossing a moderately long segment calcified proximal left common iliac artery Hattie Perch 01/19/2013  . Basal cell carcinoma excision      "numerous; all over my face, arms, back" (01/19/2013)    History   Social History  . Marital Status: Single    Spouse Name: N/A    Number of Children: 0  . Years of Education: N/A   Occupational History  .  [employer]   Social History Main Topics  . Smoking status: Former Smoker -- 1.50 packs/day for 40 years    Types: Cigarettes    Quit date: 07/26/2012  . Smokeless tobacco: Former Neurosurgeon    Types: Chew      Comment: 01/19/2013 "quit chewing 25-30 yr ago"  . Alcohol Use: No  . Drug Use: No  . Sexual Activity: Not Currently   Other Topics Concern  . Not on file   Social History Narrative   He is a single gentleman. He swims several days a week.  He is now almost a year out from IV sedation were no longer using nicotine in his electronic cigarette.   Occasional alcohol beverage.   He works as a Curator.    Family History  Problem Relation Age of Onset  . Heart failure Mother     died @ 62  . Heart attack Father     died @ 30  . Stroke Paternal Grandmother   . Heart failure Maternal Grandfather     Allergies as of 03/15/2013 - Review Complete 03/15/2013  Allergen Reaction Noted  . Carvedilol Other (See Comments) 12/24/2012    Current Outpatient Prescriptions on File Prior to Visit  Medication Sig Dispense Refill  . albuterol (PROVENTIL HFA;VENTOLIN HFA) 108 (90 BASE) MCG/ACT inhaler Inhale 2 puffs into the lungs every 6 (six) hours as needed for wheezing or shortness of breath.       . ALPRAZolam (XANAX) 0.5 MG tablet Take 0.5 mg by mouth every evening. Take 1 tablet in morning and 2 tablet at bedtime ( Per pt 1/2 tablet at bedtime only)      . amLODipine (NORVASC) 10 MG tablet Take 10 mg by mouth every evening.       [name] aspirin 81 MG EC tablet Take 81 mg by mouth at bedtime.       [name] atorvastatin (LIPITOR) 80 MG tablet Take 80 mg by mouth every morning.       . clopidogrel (PLAVIX) 75 MG tablet Take 75 mg by mouth every morning.       . folic acid (FOLVITE) 1 MG tablet Take 1 mg by mouth daily. Take 2 tablet in the morning      . hydrALAZINE (APRESOLINE) 25 MG tablet Take 1 tablet by mouth 2 (two) times daily.      [name] HYDROcodone-acetaminophen (NORCO/VICODIN) 5-325 MG per tablet Take 1 tablet by mouth every 4 (four) hours as needed for pain.       [name] KLOR-CON M10 10 MEQ tablet Take 2 tablets by mouth daily.       [name] lisinopril (PRINIVIL,ZESTRIL) 10 MG tablet Take 40 mg by mouth 2 (two)  times daily.       [name]  nitroGLYCERIN (NITROSTAT) 0.4 MG SL tablet Place 1 tablet (0.4 mg total) under the tongue every 5 (five) minutes as needed for chest pain (take for severe chest pressure ot tightness).  25 tablet  2  . valsartan-hydrochlorothiazide (DIOVAN-HCT) 320-25 MG per tablet Take 1 tablet by mouth daily.       No current facility-administered medications on file prior to visit.     REVIEW OF SYSTEMS: Cardiovascular: No chest pain, chest pressure, palpitations, orthopnea, or dyspnea on exertion.  Positive for pain in legs with walking.  Positive for leg swelling Pulmonary: No productive cough, asthma or wheezing. Neurologic: No weakness, paresthesias, aphasia, or amaurosis. No dizziness. Hematologic: No bleeding problems or clotting disorders. Musculoskeletal: No joint pain or joint swelling. Gastrointestinal: No blood in stool or hematemesis Genitourinary: No dysuria or hematuria. Psychiatric:: No history of major depression. Integumentary: No rashes or ulcers. Constitutional: No fever or chills.  PHYSICAL EXAMINATION: General: The patient appears their stated age.  Vital signs are BP 141/87  Pulse 100  Resp 16  Ht 5\' 8"  (1.727 m)  Wt 140 lb (63.504 kg)  BMI 21.29 kg/m2 HEENT:  No gross abnormalities Pulmonary: Respirations are non-labored Abdomen: Soft and non-tender  Musculoskeletal: There are no major deformities.   Neurologic: No focal weakness or paresthesias are detected, Skin: There are no ulcer or rashes noted. Psychiatric: The patient has normal affect. Cardiovascular: There is a regular rate and rhythm without significant murmur appreciated.  No carotid bruits.  Palpable right dorsalis pedis pulse.  Nonpalpable left  Diagnostic Studies: I have reviewed his most recent abdominal ultrasound in June of 2014.  This shows a 3.3 cm infrarenal abdominal aortic aneurysm.  I have also reviewed his most recent angiography.  He had successful stenting of in-stent  stenosis within the right common iliac artery.  The left common iliac artery is occluded   Assessment:  Small abdominal aortic aneurysm Cardiovascular disease with occluded left iliac stent Plan: I spent greater than 45 minutes discussing treatment options with the patient.  We discussed aortobifemoral bypass grafting.  I stated that this would treat the abdominal aneurysm as well as the iliac occlusive disease in one setting.  We also discussed the possibility of a right to left femoral-femoral bypass graft.  He understands that if he went this route, if his aneurysm increased in size that he would require treatment of his aneurysm.  This could potentially be done endovascularly but may require an open repair.  In addition I discussed that we would be basing a femoral-femoral bypass graft off of an inflow which has required repeat interventions in the past.  After our full in-depth discussion, we have decided that because the patient is able to tolerate the symptoms in his left leg currently, and because he does not have rest pain or nonhealing ulcers, that we would delay any treatment until his symptoms progressed.  I have scheduled him to follow up with me in one year.  At that time, we will evaluate the size of his abdominal aorta and make recommendations based on those results.  If his aneurysm has increased in size we would consider aortobifemoral bypass vs. endovascular repair using an aorto uni-iliac  device and a femoral-femoral bypass     V. Charlena Cross, M.D. [facility] [place] Office: [phone] Pager:  [phone]

## 2013-03-16 NOTE — Addendum Note (Signed)
Addended by: Sharee Pimple on: 03/16/2013 08:07 AM   Modules accepted: Orders

## 2013-03-19 LAB — COMPREHENSIVE METABOLIC PANEL
ALT: 23 U/L (ref 0–53)
AST: 24 U/L (ref 0–37)
Albumin: 4.5 g/dL (ref 3.5–5.2)
Calcium: 10.1 mg/dL (ref 8.4–10.5)
Chloride: 96 mEq/L (ref 96–112)
Potassium: 4.6 mEq/L (ref 3.5–5.3)
Sodium: 130 mEq/L — ABNORMAL LOW (ref 135–145)

## 2013-03-19 LAB — LIPID PANEL
LDL Cholesterol: 67 mg/dL (ref 0–99)
VLDL: 16 mg/dL (ref 0–40)

## 2013-03-25 ENCOUNTER — Telehealth: Payer: Self-pay | Admitting: *Deleted

## 2013-03-25 NOTE — Telephone Encounter (Signed)
Message copied by Tobin Chad on Thu Mar 25, 2013  1:08 PM ------      Message from: Marykay Lex      Created: Fri Mar 19, 2013  6:42 PM       Cholesterol labs look GREAT!!! - Stay the course.            Marykay Lex, MD       ------

## 2013-03-25 NOTE — Telephone Encounter (Signed)
Spoke to patient. Result given . Verbalized understanding. Patient aware results are released int MY CHART

## 2013-04-01 ENCOUNTER — Other Ambulatory Visit: Payer: Self-pay

## 2013-05-03 ENCOUNTER — Telehealth: Payer: Self-pay

## 2013-05-03 NOTE — Telephone Encounter (Signed)
Rec'd phone call from pt. Requesting to schedule his surgery with Dr. Myra Gianotti prior to 05/27/13.  Stated he has had increased pain, with walking, in left hip, and lower extremity.  Stated this has worsened since he saw Dr. Myra Gianotti in October.  Notified Dr. Myra Gianotti of pt's symptoms worsening, and of request to schedule surgery for bypass by 05/27/2013.  Per Dr. Myra Gianotti, schedule a CTA of chest, abdomen, and pelvis, and an office visit for 05/24/13;  Schedule pt. For Aortobifemoral Bypass on 05/25/13.  Notified pt. of plan.  Verb. Understanding and agrees with plan.  Informed he will be notified of CTA chest, abd., & pelvis appt., and office appt. per scheduler.

## 2013-05-04 ENCOUNTER — Other Ambulatory Visit: Payer: Self-pay

## 2013-05-04 DIAGNOSIS — I745 Embolism and thrombosis of iliac artery: Secondary | ICD-10-CM

## 2013-05-04 DIAGNOSIS — I714 Abdominal aortic aneurysm, without rupture: Secondary | ICD-10-CM

## 2013-05-11 ENCOUNTER — Encounter (HOSPITAL_COMMUNITY): Payer: Self-pay | Admitting: Pharmacy Technician

## 2013-05-13 ENCOUNTER — Other Ambulatory Visit (HOSPITAL_COMMUNITY): Payer: Self-pay | Admitting: *Deleted

## 2013-05-13 ENCOUNTER — Inpatient Hospital Stay (HOSPITAL_COMMUNITY)
Admission: RE | Admit: 2013-05-13 | Discharge: 2013-05-13 | Disposition: A | Discharge status: Home / Self Care | Payer: Federal, State, Local not specified - PPO | Source: Ambulatory Visit

## 2013-05-13 NOTE — Progress Notes (Signed)
Pt had not arrived for his PAT appt by 11:10 am, called him and he stated that he had completely forgotten about it. He asked if he could make another one for next week. I told him that I would have one of the schedulers call him. He voiced understanding and apologized for missing this appt.

## 2013-05-13 NOTE — Pre-Procedure Instructions (Signed)
Max [name]  05/13/2013   Your procedure is scheduled on:  Tuesday, May 25, 2013 at 7:30 AM.   Report to [facility] Entrance "A" Admitting Office at 5:30 AM.   Call this number if you have problems the morning of surgery: [phone]   Remember:   Do not eat food or drink liquids after midnight Monday, 05/24/13.   Take these medicines the morning of surgery with A SIP OF WATER: hydrALAZINE (APRESOLINE), nitroGLYCERIN (NITROSTAT) - if needed, HYDROcodone-acetaminophen (NORCO/VICODIN) -if needed, ALPRAZolam Prudy Feeler) - if needed, albuterol (PROVENTIL HFA;VENTOLIN HFA) inhaler - if needed.   Last dose of Plavix is to be 05/17/13 per Dr. Estanislado Spire instructions.    Do not wear jewelry.  Do not wear lotions, powders, or cologne. You may wear deodorant.  Men may shave face and neck.  Do not bring valuables to the hospital.  [facility] is not responsible                  for any belongings or valuables.               Contacts, dentures or bridgework may not be worn into surgery.  Leave suitcase in the car. After surgery it may be brought to your room.  For patients admitted to the hospital, discharge time is determined by your                treatment team.              Special Instructions: Shower using CHG 2 nights before surgery and the night before surgery.  If you shower the day of surgery use CHG.  Use special wash - you have one bottle of CHG for all showers.  You should use approximately 1/3 of the bottle for each shower.   Please read over the following fact sheets that you were given: Pain Booklet, Coughing and Deep Breathing, Blood Transfusion Information, MRSA Information and Surgical Site Infection Prevention

## 2013-05-17 ENCOUNTER — Encounter (HOSPITAL_COMMUNITY)
Admission: RE | Admit: 2013-05-17 | Discharge: 2013-05-17 | Disposition: A | Discharge status: Home / Self Care | Payer: Federal, State, Local not specified - PPO | Source: Ambulatory Visit | Attending: Surgery | Admitting: Surgery

## 2013-05-17 ENCOUNTER — Encounter (HOSPITAL_COMMUNITY): Payer: Self-pay

## 2013-05-17 ENCOUNTER — Encounter: Payer: Self-pay | Admitting: Cardiovascular Disease

## 2013-05-17 DIAGNOSIS — Z01812 Encounter for preprocedural laboratory examination: Secondary | ICD-10-CM | POA: Insufficient documentation

## 2013-05-17 HISTORY — DX: Unspecified osteoarthritis, unspecified site: M19.90

## 2013-05-17 LAB — SURGICAL PCR SCREEN: Staphylococcus aureus: NEGATIVE

## 2013-05-17 LAB — URINALYSIS, ROUTINE W REFLEX MICROSCOPIC
Bilirubin Urine: NEGATIVE
Leukocytes, UA: NEGATIVE
Nitrite: NEGATIVE
Specific Gravity, Urine: 1.006 (ref 1.005–1.030)
pH: 6 (ref 5.0–8.0)

## 2013-05-17 LAB — COMPREHENSIVE METABOLIC PANEL
AST: 24 U/L (ref 0–37)
Albumin: 3.6 g/dL (ref 3.5–5.2)
Alkaline Phosphatase: 63 U/L (ref 39–117)
Calcium: 9.3 mg/dL (ref 8.4–10.5)
Chloride: 100 mEq/L (ref 96–112)
Creatinine, Ser: 0.92 mg/dL (ref 0.50–1.35)
Potassium: 4 mEq/L (ref 3.5–5.1)
Total Bilirubin: 0.2 mg/dL — ABNORMAL LOW (ref 0.3–1.2)

## 2013-05-17 LAB — PROTIME-INR: INR: 1.09 (ref 0.00–1.49)

## 2013-05-17 LAB — BLOOD GAS, ARTERIAL
Acid-Base Excess: 0.1 mmol/L (ref 0.0–2.0)
Drawn by: 20636
FIO2: 0.21 %
pCO2 arterial: 37.4 mmHg (ref 35.0–45.0)
pH, Arterial: 7.423 (ref 7.350–7.450)

## 2013-05-17 LAB — CBC
MCH: 30.5 pg (ref 26.0–34.0)
MCV: 83.9 fL (ref 78.0–100.0)
Platelets: 163 10*3/uL (ref 150–400)
RDW: 12.9 % (ref 11.5–15.5)
WBC: 8.5 10*3/uL (ref 4.0–10.5)

## 2013-05-17 LAB — APTT: aPTT: 31 seconds (ref 24–37)

## 2013-05-17 NOTE — Pre-Procedure Instructions (Signed)
DEL OVERFELT  05/17/2013   Your procedure is scheduled on:  Tuesday, May 25, 2013 at 7:30 AM.   Report to [facility] Entrance "A" Admitting Office at 5:30 AM.   Call this number if you have problems the morning of surgery: [phone]   Remember:   Do not eat food or drink liquids after midnight Monday, 05/24/13.   Take these medicines the morning of surgery with A SIP OF WATER: hydrALAZINE (APRESOLINE), nitroGLYCERIN (NITROSTAT) - if needed, HYDROcodone-acetaminophen (NORCO/VICODIN) -if needed, ALPRAZolam Prudy Feeler) - if needed, albuterol (PROVENTIL HFA;VENTOLIN HFA) inhaler - if needed.   Last dose of Plavix is to be 05/17/13 per Dr. Estanislado Spire instructions.    Do not wear jewelry.  Do not wear lotions, powders, or cologne. You may wear deodorant.  Men may shave face and neck.  Do not bring valuables to the hospital.  [facility] is not responsible                  for any belongings or valuables.               Contacts, dentures or bridgework may not be worn into surgery.  Leave suitcase in the car. After surgery it may be brought to your room.  For patients admitted to the hospital, discharge time is determined by your                treatment team.              Special Instructions: Shower using CHG 2 nights before surgery and the night before surgery.  If you shower the day of surgery use CHG.  Use special wash - you have one bottle of CHG for all showers.  You should use approximately 1/3 of the bottle for each shower.   Please read over the following fact sheets that you were given: Pain Booklet, Coughing and Deep Breathing, Blood Transfusion Information, MRSA Information and Surgical Site Infection Prevention

## 2013-05-19 ENCOUNTER — Encounter: Payer: Self-pay | Admitting: Surgery

## 2013-05-24 ENCOUNTER — Ambulatory Visit (INDEPENDENT_AMBULATORY_CARE_PROVIDER_SITE_OTHER): Payer: Federal, State, Local not specified - PPO | Admitting: Surgery

## 2013-05-24 ENCOUNTER — Ambulatory Visit
Admission: RE | Admit: 2013-05-24 | Discharge: 2013-05-24 | Disposition: A | Discharge status: Home / Self Care | Payer: Federal, State, Local not specified - PPO | Source: Ambulatory Visit | Attending: Surgery | Admitting: Surgery

## 2013-05-24 ENCOUNTER — Encounter: Payer: Self-pay | Admitting: Surgery

## 2013-05-24 VITALS — BP 160/93 | HR 60 | Resp 14 | Ht 68.0 in | Wt 126.0 lb

## 2013-05-24 DIAGNOSIS — I714 Abdominal aortic aneurysm, without rupture: Secondary | ICD-10-CM

## 2013-05-24 DIAGNOSIS — I745 Embolism and thrombosis of iliac artery: Secondary | ICD-10-CM

## 2013-05-24 MED ORDER — DEXTROSE 5 % IV SOLN
1.5000 g | INTRAVENOUS | Status: AC
  Administered 2013-05-25: 1.5 g via INTRAVENOUS
  Filled 2013-05-24: qty 1.5

## 2013-05-24 MED ORDER — IOHEXOL 350 MG/ML SOLN
60.0000 mL | Freq: Once | INTRAVENOUS | Status: AC | PRN
  Administered 2013-05-24: 60 mL via INTRAVENOUS

## 2013-05-24 NOTE — Progress Notes (Signed)
   Patient name: Faye L Hollon MRN: [id number] DOB: 04/02/1956 Sex: male     Chief Complaint  Patient presents with  . Follow-up    Discuss CTA ches, Abd/pelvis    HISTORY OF PRESENT ILLNESS: The patient is back today for discussions regarding bilateral claudication, left greater than right and a small bowel aortic aneurysm.  I initially saw him in September for similar issues.  He has a history of bilateral iliac stenting in 2000 and [place].  He has had multiple stents placed in bilateral iliac arteries, most recently this was studied and performed by Dr. Berry.  He was found to have an occluded left iliac stent which could not be recanalized.  At that time, he did not feel his symptoms were severe enough to warrant revascularization.  He was also having difficulty with his blood pressure, being on the high side.  This has now been corrected and with a lower blood pressures, he is having worsening symptoms in his legs and wishes to have something done about this.  The patient has had a low risk nuclear study in 2013.  He is on a statin for hypercholesterolemia.  He also is medically managed for his hypertension.  He has a atretic right kidney.  Past Medical History  Diagnosis Date  . CAD (coronary artery disease), native coronary artery 2001    PCI RCA, low risk Nuc 11/13  . LV dysfunction 03/2012    Echo -  EF 45-50%, mod Conc LVH, mid Inf HK  . Normal cardiac stress test 04/07/2012    Lexiscan myoview; EF 45%, no ischemia or infarct  . Hyperlipidemia 04/07/2012  . PAD (peripheral artery disease) 12/07/12    with stent-bil. iliac, RCIA PTA 12/07/12  . Back pain, chronic     multiple back surgeries   . AAA (abdominal aortic aneurysm)     mild aneurysmal dilatation  . Near syncope 03/2012    Event monitor - PACs, PVCs - no arrhythmia  . Hypertension   . Heart murmur     "outgrew it" (01/19/2013)  . MI (myocardial infarction) 2001    at [facility]; PCI - RCA  .  Asthma   . Pneumonia     "numerous times; last time ~ a year or 2 ago" (01/19/2013)  . Chronic bronchitis     "q couple years or so" (01/19/2013)  . Stroke 2014    "little one; little numbness and tingling in left thumb and left facial expressions not the same since" (01/19/2013)  . Basal cell cancer   . Right renal artery stenosis 12/07/12    Rt RAS  . Kidney stone   . Nonfunctioning kidney     "right kidney has atrophied to 1/3 size of the left" (01/19/2013)  . DM (diabetes mellitus) type II controlled peripheral vascular disorder     no med since 6/13 lost unexplained weight  . Arthritis     Past Surgical History  Procedure Laterality Date  . Coronary angioplasty with stent placement  2001    to RCA @ [facility]  . Lumbar laminectomy  1995, 2000,2001    L5-S1  . Iliac artery stent Bilateral     "I have 6 total" (01/19/2013)  . Iliac artery stent Right 12/07/2012    Dr Berry  . Nasal sinus surgery    . Lithotripsy      "twice I think" (01/19/2013)  . Abdominal aortagram  01/19/2013    unsuccessful attempt at crossing a moderately   long segment calcified proximal left common iliac artery /notes 01/19/2013  . Basal cell carcinoma excision      "numerous; all over my face, arms, back" (01/19/2013)    History   Social History  . Marital Status: Single    Spouse Name: N/A    Number of Children: 0  . Years of Education: N/A   Occupational History  .  [employer]   Social History Main Topics  . Smoking status: Former Smoker -- 1.50 packs/day for 40 years    Types: Cigarettes    Quit date: 07/26/2012  . Smokeless tobacco: Former User    Types: Chew     Comment: 01/19/2013 "quit chewing 25-30 yr ago"  . Alcohol Use: No  . Drug Use: No  . Sexual Activity: Not Currently   Other Topics Concern  . Not on file   Social History Narrative   He is a single gentleman. He swims several days a week.  He is now almost a year out from IV sedation were no longer using nicotine in his  electronic cigarette.   Occasional alcohol beverage.   He works as a mechanic.    Family History  Problem Relation Age of Onset  . Heart failure Mother     died @ 74  . Heart attack Father     died @ 68  . Stroke Paternal Grandmother   . Heart failure Maternal Grandfather     Allergies as of 05/24/2013 - Review Complete 05/17/2013  Allergen Reaction Noted  . Carvedilol Other (See Comments) 12/24/2012    Current Outpatient Prescriptions on File Prior to Visit  Medication Sig Dispense Refill  . albuterol (PROVENTIL HFA;VENTOLIN HFA) 108 (90 BASE) MCG/ACT inhaler Inhale 2 puffs into the lungs every 6 (six) hours as needed for wheezing or shortness of breath.       . ALPRAZolam (XANAX) 0.5 MG tablet Take 0.5 mg by mouth every evening. Take 1 tablet in morning and 2 tablet at bedtime ( Per pt 1/2 tablet at bedtime only)      . amLODipine (NORVASC) 10 MG tablet Take 10 mg by mouth every evening.       . aspirin 81 MG EC tablet Take 81 mg by mouth every morning.       . atorvastatin (LIPITOR) 80 MG tablet Take 80 mg by mouth every morning.       . clopidogrel (PLAVIX) 75 MG tablet Take 75 mg by mouth every morning.       . folic acid (FOLVITE) 1 MG tablet Take 1 mg by mouth daily. Take 2 tablet in the morning      . HYDROcodone-acetaminophen (NORCO/VICODIN) 5-325 MG per tablet Take 1 tablet by mouth every 4 (four) hours as needed for pain.       . KLOR-CON M10 10 MEQ tablet Take 2 tablets by mouth daily.       . lisinopril (PRINIVIL,ZESTRIL) 20 MG tablet Take 20 mg by mouth 2 (two) times daily.      . nitroGLYCERIN (NITROSTAT) 0.4 MG SL tablet Place 1 tablet (0.4 mg total) under the tongue every 5 (five) minutes as needed for chest pain (take for severe chest pressure ot tightness).  25 tablet  2  . tamsulosin (FLOMAX) 0.4 MG CAPS capsule Take 0.4 mg by mouth daily after supper.      . valsartan-hydrochlorothiazide (DIOVAN-HCT) 320-25 MG per tablet Take 1 tablet by mouth daily.      .  hydrALAZINE (APRESOLINE)   25 MG tablet Take 1 tablet by mouth 2 (two) times daily.       No current facility-administered medications on file prior to visit.     REVIEW OF SYSTEMS: Please see history of present illness, otherwise all systems are unchanged from prior visit  PHYSICAL EXAMINATION:   Vital signs are BP 160/93  Pulse 60  Resp 14  Ht 5' 8" (1.727 m)  Wt 126 lb (57.153 kg)  BMI 19.16 kg/m2  SpO2 98% General: The patient appears their stated age. HEENT:  No gross abnormalities Pulmonary:  Non labored breathing Abdomen: Soft and non-tender Musculoskeletal: There are no major deformities. Neurologic: No focal weakness or paresthesias are detected, Skin: There are no ulcer or rashes noted. Psychiatric: The patient has normal affect. Cardiovascular: There is a regular rate and rhythm without significant murmur appreciated.   Diagnostic Studies CT angiogram was reviewed by myself today.  This shows occluded left iliac stent.  Bilateral femoral arteries are widely patent.  He has a 3.7 cm infrarenal abdominal aortic aneurysm  Assessment: Abdominal aortic aneurysm and claudication Plan: I discussed to potential surgical options to address both the aneurysm as well as the claudication.  The first would be an aortobifemoral bypass graft.  The second would be aortoiliac stenting and right to left femoral-femoral bypass graft.  After a lengthy, well informed discussion, the patient has requested to proceed with an aortobifemoral bypass graft.  The risks and benefits were discussed extensively with him and all of his questions were answered.  He has been off of his Plavix for at least one week.  His surgery is scheduled for tomorrow  V. Wells Brabham IV, M.D. [facility] [place] Office: [phone] Pager:  [phone]    

## 2013-05-25 ENCOUNTER — Inpatient Hospital Stay (HOSPITAL_COMMUNITY): Payer: Federal, State, Local not specified - PPO

## 2013-05-25 ENCOUNTER — Encounter: Payer: Self-pay | Admitting: *Deleted

## 2013-05-25 ENCOUNTER — Inpatient Hospital Stay (HOSPITAL_COMMUNITY): Payer: Federal, State, Local not specified - PPO | Admitting: Anesthesiology

## 2013-05-25 ENCOUNTER — Encounter (HOSPITAL_COMMUNITY)
Admission: RE | Disposition: A | Discharge status: Home / Self Care | Payer: Federal, State, Local not specified - PPO | Source: Ambulatory Visit | Attending: Surgery

## 2013-05-25 ENCOUNTER — Encounter (HOSPITAL_COMMUNITY): Payer: Federal, State, Local not specified - PPO | Admitting: Anesthesiology

## 2013-05-25 ENCOUNTER — Encounter (HOSPITAL_COMMUNITY): Payer: Self-pay | Admitting: *Deleted

## 2013-05-25 ENCOUNTER — Inpatient Hospital Stay (HOSPITAL_COMMUNITY)
Admission: RE | Admit: 2013-05-25 | Discharge: 2013-05-31 | DRG: 238 | Disposition: A | Discharge status: Home / Self Care | Payer: Federal, State, Local not specified - PPO | Source: Ambulatory Visit | Attending: Surgery | Admitting: Surgery

## 2013-05-25 DIAGNOSIS — G8929 Other chronic pain: Secondary | ICD-10-CM | POA: Diagnosis present

## 2013-05-25 DIAGNOSIS — I252 Old myocardial infarction: Secondary | ICD-10-CM

## 2013-05-25 DIAGNOSIS — I2510 Atherosclerotic heart disease of native coronary artery without angina pectoris: Secondary | ICD-10-CM | POA: Diagnosis present

## 2013-05-25 DIAGNOSIS — E119 Type 2 diabetes mellitus without complications: Secondary | ICD-10-CM | POA: Diagnosis present

## 2013-05-25 DIAGNOSIS — I10 Essential (primary) hypertension: Secondary | ICD-10-CM | POA: Diagnosis present

## 2013-05-25 DIAGNOSIS — T82898A Other specified complication of vascular prosthetic devices, implants and grafts, initial encounter: Secondary | ICD-10-CM | POA: Diagnosis present

## 2013-05-25 DIAGNOSIS — I714 Abdominal aortic aneurysm, without rupture, unspecified: Principal | ICD-10-CM | POA: Diagnosis present

## 2013-05-25 DIAGNOSIS — E785 Hyperlipidemia, unspecified: Secondary | ICD-10-CM | POA: Diagnosis present

## 2013-05-25 DIAGNOSIS — Z87891 Personal history of nicotine dependence: Secondary | ICD-10-CM

## 2013-05-25 DIAGNOSIS — Z8673 Personal history of transient ischemic attack (TIA), and cerebral infarction without residual deficits: Secondary | ICD-10-CM

## 2013-05-25 DIAGNOSIS — D62 Acute posthemorrhagic anemia: Secondary | ICD-10-CM | POA: Diagnosis not present

## 2013-05-25 DIAGNOSIS — Z9861 Coronary angioplasty status: Secondary | ICD-10-CM

## 2013-05-25 DIAGNOSIS — J45909 Unspecified asthma, uncomplicated: Secondary | ICD-10-CM | POA: Diagnosis present

## 2013-05-25 DIAGNOSIS — I739 Peripheral vascular disease, unspecified: Secondary | ICD-10-CM

## 2013-05-25 DIAGNOSIS — L02219 Cutaneous abscess of trunk, unspecified: Secondary | ICD-10-CM | POA: Diagnosis present

## 2013-05-25 DIAGNOSIS — E7800 Pure hypercholesterolemia, unspecified: Secondary | ICD-10-CM | POA: Diagnosis present

## 2013-05-25 DIAGNOSIS — Y838 Other surgical procedures as the cause of abnormal reaction of the patient, or of later complication, without mention of misadventure at the time of the procedure: Secondary | ICD-10-CM | POA: Diagnosis present

## 2013-05-25 DIAGNOSIS — I701 Atherosclerosis of renal artery: Secondary | ICD-10-CM | POA: Diagnosis present

## 2013-05-25 DIAGNOSIS — E440 Moderate protein-calorie malnutrition: Secondary | ICD-10-CM | POA: Diagnosis present

## 2013-05-25 DIAGNOSIS — I70219 Atherosclerosis of native arteries of extremities with intermittent claudication, unspecified extremity: Secondary | ICD-10-CM

## 2013-05-25 DIAGNOSIS — E1159 Type 2 diabetes mellitus with other circulatory complications: Secondary | ICD-10-CM | POA: Diagnosis present

## 2013-05-25 HISTORY — PX: AORTA - BILATERAL FEMORAL ARTERY BYPASS GRAFT: SHX1175

## 2013-05-25 LAB — POCT I-STAT 7, (LYTES, BLD GAS, ICA,H+H)
Acid-Base Excess: 4 mmol/L — ABNORMAL HIGH (ref 0.0–2.0)
Acid-base deficit: 1 mmol/L (ref 0.0–2.0)
Bicarbonate: 22.6 mEq/L (ref 20.0–24.0)
Calcium, Ion: 0.99 mmol/L — ABNORMAL LOW (ref 1.12–1.23)
HCT: 30 % — ABNORMAL LOW (ref 39.0–52.0)
HCT: 27 % — ABNORMAL LOW (ref 39.0–52.0)
Hemoglobin: 10.2 g/dL — ABNORMAL LOW (ref 13.0–17.0)
O2 Saturation: 100 %
O2 Saturation: 100 %
Patient temperature: 35.1
Potassium: 3 mEq/L — ABNORMAL LOW (ref 3.7–5.3)
Potassium: 3.7 mEq/L (ref 3.7–5.3)
Sodium: 138 mEq/L (ref 137–147)
TCO2: 24 mmol/L (ref 0–100)
pCO2 arterial: 29.6 mmHg — ABNORMAL LOW (ref 35.0–45.0)
pH, Arterial: 7.496 — ABNORMAL HIGH (ref 7.350–7.450)
pO2, Arterial: 482 mmHg — ABNORMAL HIGH (ref 80.0–100.0)
pO2, Arterial: 271 mmHg — ABNORMAL HIGH (ref 80.0–100.0)

## 2013-05-25 LAB — POCT I-STAT GLUCOSE
Glucose, Bld: 100 mg/dL — ABNORMAL HIGH (ref 70–99)
Operator id: 139621

## 2013-05-25 LAB — BLOOD GAS, ARTERIAL
Bicarbonate: 26.6 mEq/L — ABNORMAL HIGH (ref 20.0–24.0)
O2 Saturation: 100 %
Patient temperature: 98.6
TCO2: 28 mmol/L (ref 0–100)
pCO2 arterial: 46.5 mmHg — ABNORMAL HIGH (ref 35.0–45.0)
pO2, Arterial: 308 mmHg — ABNORMAL HIGH (ref 80.0–100.0)

## 2013-05-25 LAB — BASIC METABOLIC PANEL
BUN: 12 mg/dL (ref 6–23)
Calcium: 8.4 mg/dL (ref 8.4–10.5)
GFR calc non Af Amer: >90 mL/min (ref >90)
Sodium: 139 mEq/L (ref 137–147)

## 2013-05-25 LAB — TYPE AND SCREEN
ABO/RH(D): A POS
Antibody Screen: NEGATIVE

## 2013-05-25 LAB — CBC
Hemoglobin: 12.5 g/dL — ABNORMAL LOW (ref 13.0–17.0)
MCH: 30.6 pg (ref 26.0–34.0)
MCHC: 35.4 g/dL (ref 30.0–36.0)
Platelets: 149 10*3/uL — ABNORMAL LOW (ref 150–400)
RDW: 13.1 % (ref 11.5–15.5)
WBC: 16.5 10*3/uL — ABNORMAL HIGH (ref 4.0–10.5)

## 2013-05-25 LAB — ABO/RH: ABO/RH(D): A POS

## 2013-05-25 LAB — MAGNESIUM: Magnesium: 1.8 mg/dL (ref 1.5–2.5)

## 2013-05-25 LAB — PROTIME-INR
INR: 1.23 (ref 0.00–1.49)
Prothrombin Time: 15.2 seconds (ref 11.6–15.2)

## 2013-05-25 LAB — GLUCOSE, CAPILLARY
Glucose-Capillary: 97 mg/dL (ref 70–99)
Glucose-Capillary: 122 mg/dL — ABNORMAL HIGH (ref 70–99)

## 2013-05-25 SURGERY — CREATION, BYPASS, ARTERIAL, AORTA TO FEMORAL, BILATERAL, USING GRAFT
Anesthesia: General | Site: Abdomen

## 2013-05-25 MED ORDER — VECURONIUM BROMIDE 10 MG IV SOLR
INTRAVENOUS | Status: DC | PRN
  Administered 2013-05-25: 2 mg via INTRAVENOUS
  Administered 2013-05-25: 1 mg via INTRAVENOUS

## 2013-05-25 MED ORDER — MAGNESIUM SULFATE 40 MG/ML IJ SOLN
2.0000 g | Freq: Every day | INTRAMUSCULAR | Status: DC | PRN
  Filled 2013-05-25: qty 50

## 2013-05-25 MED ORDER — HYDROCHLOROTHIAZIDE 25 MG PO TABS
25.0000 mg | ORAL_TABLET | Freq: Every day | ORAL | Status: DC
  Administered 2013-05-28 – 2013-05-31 (×4): 25 mg via ORAL
  Filled 2013-05-25 (×7): qty 1

## 2013-05-25 MED ORDER — ALPRAZOLAM 0.25 MG PO TABS
0.5000 mg | ORAL_TABLET | Freq: Every evening | ORAL | Status: DC
  Administered 2013-05-27 – 2013-05-30 (×4): 0.5 mg via ORAL
  Filled 2013-05-25 (×4): qty 2

## 2013-05-25 MED ORDER — NEOSTIGMINE METHYLSULFATE 1 MG/ML IJ SOLN
INTRAMUSCULAR | Status: DC | PRN
  Administered 2013-05-25: 4 mg via INTRAVENOUS

## 2013-05-25 MED ORDER — OXYCODONE HCL 5 MG PO TABS: 5.0000 mg | ORAL_TABLET | Freq: Once | ORAL | Status: DC | PRN

## 2013-05-25 MED ORDER — KCL IN DEXTROSE-NACL 20-5-0.9 MEQ/L-%-% IV SOLN
INTRAVENOUS | Status: DC
  Administered 2013-05-25: 16:00:00 via INTRAVENOUS
  Administered 2013-05-26: 125 mL/h via INTRAVENOUS
  Administered 2013-05-26: via INTRAVENOUS
  Administered 2013-05-26: 125 mL/h via INTRAVENOUS
  Administered 2013-05-27 – 2013-05-28 (×4): via INTRAVENOUS
  Filled 2013-05-25 (×24): qty 1000

## 2013-05-25 MED ORDER — FOLIC ACID 1 MG PO TABS
1.0000 mg | ORAL_TABLET | Freq: Every day | ORAL | Status: DC
  Administered 2013-05-28 – 2013-05-31 (×4): 1 mg via ORAL
  Filled 2013-05-25 (×7): qty 1

## 2013-05-25 MED ORDER — HYDRALAZINE HCL 20 MG/ML IJ SOLN
INTRAMUSCULAR | Status: AC
  Filled 2013-05-25: qty 1

## 2013-05-25 MED ORDER — HEPARIN SODIUM (PORCINE) 5000 UNIT/ML IJ SOLN
INTRAMUSCULAR | Status: DC | PRN
  Administered 2013-05-25: 07:00:00

## 2013-05-25 MED ORDER — PHENOL 1.4 % MT LIQD: 1.0000 | OROMUCOSAL | Status: DC | PRN

## 2013-05-25 MED ORDER — ASPIRIN EC 81 MG PO TBEC
81.0000 mg | DELAYED_RELEASE_TABLET | Freq: Every morning | ORAL | Status: DC
  Administered 2013-05-28 – 2013-05-31 (×4): 81 mg via ORAL
  Filled 2013-05-25 (×7): qty 1

## 2013-05-25 MED ORDER — FENTANYL CITRATE 0.05 MG/ML IJ SOLN
INTRAMUSCULAR | Status: DC | PRN
  Administered 2013-05-25: 100 ug via INTRAVENOUS
  Administered 2013-05-25: 50 ug via INTRAVENOUS
  Administered 2013-05-25: 100 ug via INTRAVENOUS
  Administered 2013-05-25 (×4): 50 ug via INTRAVENOUS

## 2013-05-25 MED ORDER — VALSARTAN-HYDROCHLOROTHIAZIDE 320-25 MG PO TABS: 1.0000 | ORAL_TABLET | Freq: Every day | ORAL | Status: DC

## 2013-05-25 MED ORDER — NITROGLYCERIN 0.2 MG/ML ON CALL CATH LAB: INTRAVENOUS | Status: DC | PRN

## 2013-05-25 MED ORDER — HEPARIN SODIUM (PORCINE) 1000 UNIT/ML IJ SOLN
INTRAMUSCULAR | Status: DC | PRN
  Administered 2013-05-25: 1000 [IU] via INTRAVENOUS
  Administered 2013-05-25: 5500 [IU] via INTRAVENOUS

## 2013-05-25 MED ORDER — MORPHINE SULFATE 2 MG/ML IJ SOLN
INTRAMUSCULAR | Status: AC
  Filled 2013-05-25: qty 1

## 2013-05-25 MED ORDER — ACETAMINOPHEN 325 MG RE SUPP
325.0000 mg | RECTAL | Status: DC | PRN
  Filled 2013-05-25: qty 2

## 2013-05-25 MED ORDER — 0.9 % SODIUM CHLORIDE (POUR BTL) OPTIME
TOPICAL | Status: DC | PRN
  Administered 2013-05-25: 3000 mL

## 2013-05-25 MED ORDER — ONDANSETRON HCL 4 MG/2ML IJ SOLN
INTRAMUSCULAR | Status: AC
  Filled 2013-05-25: qty 2

## 2013-05-25 MED ORDER — ALBUMIN HUMAN 5 % IV SOLN
INTRAVENOUS | Status: DC | PRN
  Administered 2013-05-25 (×2): via INTRAVENOUS

## 2013-05-25 MED ORDER — PANTOPRAZOLE SODIUM 40 MG PO TBEC
40.0000 mg | DELAYED_RELEASE_TABLET | Freq: Every day | ORAL | Status: DC
  Administered 2013-05-28 – 2013-05-30 (×3): 40 mg via ORAL
  Filled 2013-05-25 (×3): qty 1

## 2013-05-25 MED ORDER — PROTAMINE SULFATE 10 MG/ML IV SOLN
INTRAVENOUS | Status: DC | PRN
  Administered 2013-05-25: 10 mg via INTRAVENOUS

## 2013-05-25 MED ORDER — LACTATED RINGERS IV SOLN
INTRAVENOUS | Status: DC | PRN
  Administered 2013-05-25 (×2): via INTRAVENOUS

## 2013-05-25 MED ORDER — POTASSIUM CHLORIDE CRYS ER 20 MEQ PO TBCR: 20.0000 meq | EXTENDED_RELEASE_TABLET | Freq: Every day | ORAL | Status: DC | PRN

## 2013-05-25 MED ORDER — GUAIFENESIN-DM 100-10 MG/5ML PO SYRP: 15.0000 mL | ORAL_SOLUTION | ORAL | Status: DC | PRN

## 2013-05-25 MED ORDER — PROPOFOL 10 MG/ML IV BOLUS
INTRAVENOUS | Status: DC | PRN
  Administered 2013-05-25: 150 mg via INTRAVENOUS

## 2013-05-25 MED ORDER — NITROGLYCERIN IN D5W 200-5 MCG/ML-% IV SOLN
INTRAVENOUS | Status: DC | PRN
  Administered 2013-05-25: 16.6 ug/min via INTRAVENOUS

## 2013-05-25 MED ORDER — GLYCOPYRROLATE 0.2 MG/ML IJ SOLN
INTRAMUSCULAR | Status: DC | PRN
  Administered 2013-05-25: .8 mg via INTRAVENOUS

## 2013-05-25 MED ORDER — ALBUTEROL SULFATE HFA 108 (90 BASE) MCG/ACT IN AERS
2.0000 | INHALATION_SPRAY | Freq: Four times a day (QID) | RESPIRATORY_TRACT | Status: DC | PRN
  Filled 2013-05-25: qty 6.7

## 2013-05-25 MED ORDER — MORPHINE SULFATE 4 MG/ML IJ SOLN
4.0000 mg | INTRAMUSCULAR | Status: DC | PRN
  Administered 2013-05-25: 4 mg via INTRAVENOUS
  Filled 2013-05-25: qty 1

## 2013-05-25 MED ORDER — TAMSULOSIN HCL 0.4 MG PO CAPS
0.4000 mg | ORAL_CAPSULE | Freq: Every day | ORAL | Status: DC
  Administered 2013-05-27 – 2013-05-30 (×4): 0.4 mg via ORAL
  Filled 2013-05-25 (×7): qty 1

## 2013-05-25 MED ORDER — ONDANSETRON HCL 4 MG/2ML IJ SOLN
INTRAMUSCULAR | Status: DC | PRN
  Administered 2013-05-25: 4 mg via INTRAVENOUS

## 2013-05-25 MED ORDER — LACTATED RINGERS IV SOLN
INTRAVENOUS | Status: DC | PRN
  Administered 2013-05-25 (×2): via INTRAVENOUS

## 2013-05-25 MED ORDER — MORPHINE SULFATE 2 MG/ML IJ SOLN
2.0000 mg | INTRAMUSCULAR | Status: DC | PRN
  Administered 2013-05-25 (×5): 2 mg via INTRAVENOUS
  Filled 2013-05-25 (×4): qty 1

## 2013-05-25 MED ORDER — ONDANSETRON HCL 4 MG/2ML IJ SOLN
4.0000 mg | Freq: Four times a day (QID) | INTRAMUSCULAR | Status: AC | PRN
  Administered 2013-05-25: 4 mg via INTRAVENOUS

## 2013-05-25 MED ORDER — ATORVASTATIN CALCIUM 80 MG PO TABS
80.0000 mg | ORAL_TABLET | Freq: Every day | ORAL | Status: DC
  Administered 2013-05-27 – 2013-05-30 (×4): 80 mg via ORAL
  Filled 2013-05-25 (×7): qty 1

## 2013-05-25 MED ORDER — ACETAMINOPHEN 325 MG PO TABS: 325.0000 mg | ORAL_TABLET | ORAL | Status: DC | PRN

## 2013-05-25 MED ORDER — MIDAZOLAM HCL 5 MG/5ML IJ SOLN
INTRAMUSCULAR | Status: DC | PRN
  Administered 2013-05-25: 2 mg via INTRAVENOUS

## 2013-05-25 MED ORDER — ALUM & MAG HYDROXIDE-SIMETH 200-200-20 MG/5ML PO SUSP: 15.0000 mL | ORAL | Status: DC | PRN

## 2013-05-25 MED ORDER — AMLODIPINE BESYLATE 10 MG PO TABS
10.0000 mg | ORAL_TABLET | Freq: Every evening | ORAL | Status: DC
  Administered 2013-05-27 – 2013-05-30 (×4): 10 mg via ORAL
  Filled 2013-05-25 (×7): qty 1

## 2013-05-25 MED ORDER — HYDRALAZINE HCL 20 MG/ML IJ SOLN
INTRAMUSCULAR | Status: DC | PRN
  Administered 2013-05-25 (×2): 10 mg via INTRAVENOUS

## 2013-05-25 MED ORDER — DEXTROSE 5 % IV SOLN
1.5000 g | Freq: Two times a day (BID) | INTRAVENOUS | Status: AC
  Administered 2013-05-25 – 2013-05-26 (×2): 1.5 g via INTRAVENOUS
  Filled 2013-05-25 (×2): qty 1.5

## 2013-05-25 MED ORDER — SODIUM CHLORIDE 0.9 % IV SOLN
10.0000 mg | INTRAVENOUS | Status: DC | PRN
  Administered 2013-05-25: 25 ug/min via INTRAVENOUS

## 2013-05-25 MED ORDER — DOPAMINE-DEXTROSE 3.2-5 MG/ML-% IV SOLN: 3.0000 ug/kg/min | INTRAVENOUS | Status: DC

## 2013-05-25 MED ORDER — HYDRALAZINE HCL 20 MG/ML IJ SOLN
10.0000 mg | INTRAMUSCULAR | Status: DC | PRN
  Administered 2013-05-26: 10 mg via INTRAVENOUS
  Filled 2013-05-25: qty 1

## 2013-05-25 MED ORDER — HYDRALAZINE HCL 25 MG PO TABS
25.0000 mg | ORAL_TABLET | Freq: Two times a day (BID) | ORAL | Status: DC
  Administered 2013-05-27 – 2013-05-31 (×8): 25 mg via ORAL
  Filled 2013-05-25 (×14): qty 1

## 2013-05-25 MED ORDER — CLOPIDOGREL BISULFATE 75 MG PO TABS
75.0000 mg | ORAL_TABLET | Freq: Every day | ORAL | Status: DC
  Administered 2013-05-28 – 2013-05-31 (×4): 75 mg via ORAL
  Filled 2013-05-25 (×10): qty 1

## 2013-05-25 MED ORDER — LACTATED RINGERS IV SOLN
INTRAVENOUS | Status: DC | PRN
  Administered 2013-05-25: 07:00:00 via INTRAVENOUS

## 2013-05-25 MED ORDER — SODIUM CHLORIDE 0.9 % IV SOLN: 500.0000 mL | Freq: Once | INTRAVENOUS | Status: AC | PRN

## 2013-05-25 MED ORDER — BISACODYL 10 MG RE SUPP: 10.0000 mg | Freq: Every day | RECTAL | Status: DC | PRN

## 2013-05-25 MED ORDER — HYDROMORPHONE HCL PF 1 MG/ML IJ SOLN
0.2500 mg | INTRAMUSCULAR | Status: DC | PRN
  Administered 2013-05-25: 0.5 mg via INTRAVENOUS

## 2013-05-25 MED ORDER — VALSARTAN 160 MG PO TABS
320.0000 mg | ORAL_TABLET | Freq: Every day | ORAL | Status: DC
  Administered 2013-05-28 – 2013-05-31 (×4): 320 mg via ORAL
  Filled 2013-05-25 (×7): qty 2

## 2013-05-25 MED ORDER — LABETALOL HCL 5 MG/ML IV SOLN
INTRAVENOUS | Status: DC | PRN
  Administered 2013-05-25 (×2): 2.5 mg via INTRAVENOUS

## 2013-05-25 MED ORDER — POTASSIUM CHLORIDE CRYS ER 20 MEQ PO TBCR
20.0000 meq | EXTENDED_RELEASE_TABLET | Freq: Every day | ORAL | Status: DC
Start: 2013-05-25 — End: 2013-05-31
  Administered 2013-05-29 – 2013-05-31 (×3): 20 meq via ORAL
  Filled 2013-05-25 (×6): qty 1

## 2013-05-25 MED ORDER — OXYCODONE HCL 5 MG/5ML PO SOLN: 5.0000 mg | Freq: Once | ORAL | Status: DC | PRN

## 2013-05-25 MED ORDER — LABETALOL HCL 5 MG/ML IV SOLN
INTRAVENOUS | Status: AC
  Administered 2013-05-25: 5 mg via INTRAVENOUS
  Filled 2013-05-25: qty 4

## 2013-05-25 MED ORDER — LISINOPRIL 20 MG PO TABS
20.0000 mg | ORAL_TABLET | Freq: Two times a day (BID) | ORAL | Status: DC
  Administered 2013-05-27 – 2013-05-31 (×8): 20 mg via ORAL
  Filled 2013-05-25 (×14): qty 1

## 2013-05-25 MED ORDER — SODIUM BICARBONATE 8.4 % IV SOLN
INTRAVENOUS | Status: DC | PRN
  Administered 2013-05-25 (×2): 25 meq via INTRAVENOUS

## 2013-05-25 MED ORDER — DOCUSATE SODIUM 100 MG PO CAPS
100.0000 mg | ORAL_CAPSULE | Freq: Every day | ORAL | Status: DC
  Administered 2013-05-28 – 2013-05-31 (×4): 100 mg via ORAL
  Filled 2013-05-25 (×4): qty 1

## 2013-05-25 MED ORDER — HEMOSTATIC AGENTS (NO CHARGE) OPTIME
TOPICAL | Status: DC | PRN
  Administered 2013-05-25 (×2): 1 via TOPICAL

## 2013-05-25 MED ORDER — NITROGLYCERIN 0.4 MG SL SUBL: 0.4000 mg | SUBLINGUAL_TABLET | SUBLINGUAL | Status: DC | PRN

## 2013-05-25 MED ORDER — SODIUM CHLORIDE 0.9 % IV SOLN: INTRAVENOUS | Status: DC

## 2013-05-25 MED ORDER — SENNOSIDES-DOCUSATE SODIUM 8.6-50 MG PO TABS
1.0000 | ORAL_TABLET | Freq: Every evening | ORAL | Status: DC | PRN
  Filled 2013-05-25: qty 1

## 2013-05-25 MED ORDER — LABETALOL HCL 5 MG/ML IV SOLN
5.0000 mg | INTRAVENOUS | Status: DC | PRN
  Administered 2013-05-25: 5 mg via INTRAVENOUS

## 2013-05-25 MED ORDER — HYDROMORPHONE HCL PF 1 MG/ML IJ SOLN
INTRAMUSCULAR | Status: AC
  Administered 2013-05-25: 0.5 mg via INTRAVENOUS
  Filled 2013-05-25: qty 1

## 2013-05-25 MED ORDER — ROCURONIUM BROMIDE 100 MG/10ML IV SOLN
INTRAVENOUS | Status: DC | PRN
  Administered 2013-05-25: 10 mg via INTRAVENOUS
  Administered 2013-05-25: 50 mg via INTRAVENOUS
  Administered 2013-05-25 (×2): 20 mg via INTRAVENOUS

## 2013-05-25 MED ORDER — ENOXAPARIN SODIUM 30 MG/0.3ML ~~LOC~~ SOLN
30.0000 mg | SUBCUTANEOUS | Status: DC
  Administered 2013-05-26: 30 mg via SUBCUTANEOUS
  Filled 2013-05-25 (×2): qty 0.3

## 2013-05-25 SURGICAL SUPPLY — 68 items
ADH SKN CLS APL DERMABOND .7 (GAUZE/BANDAGES/DRESSINGS) ×3
BLADE SURG ROTATE 9660 (MISCELLANEOUS) ×1 IMPLANT
CANISTER SUCTION 2500CC (MISCELLANEOUS) ×2 IMPLANT
CLIP TI MEDIUM 24 (CLIP) ×2 IMPLANT
CLIP TI WIDE RED SMALL 24 (CLIP) ×2 IMPLANT
COVER MAYO STAND STRL (DRAPES) ×2 IMPLANT
COVER SURGICAL LIGHT HANDLE (MISCELLANEOUS) ×2 IMPLANT
DERMABOND ADVANCED (GAUZE/BANDAGES/DRESSINGS) ×3
DERMABOND ADVANCED .7 DNX12 (GAUZE/BANDAGES/DRESSINGS) ×2 IMPLANT
DRAPE WARM FLUID 44X44 (DRAPE) ×2 IMPLANT
ELECT BLADE 4.0 EZ CLEAN MEGAD (MISCELLANEOUS) ×2
ELECT BLADE 6.5 EXT (BLADE) IMPLANT
ELECT REM PT RETURN 9FT ADLT (ELECTROSURGICAL) ×2
ELECTRODE BLDE 4.0 EZ CLN MEGD (MISCELLANEOUS) ×1 IMPLANT
ELECTRODE REM PT RTRN 9FT ADLT (ELECTROSURGICAL) ×1 IMPLANT
GLOVE BIO SURGEON STRL SZ 6.5 (GLOVE) ×1 IMPLANT
GLOVE BIOGEL PI IND STRL 6.5 (GLOVE) IMPLANT
GLOVE BIOGEL PI IND STRL 7.0 (GLOVE) IMPLANT
GLOVE BIOGEL PI IND STRL 7.5 (GLOVE) ×1 IMPLANT
GLOVE BIOGEL PI IND STRL 8 (GLOVE) IMPLANT
GLOVE BIOGEL PI INDICATOR 6.5 (GLOVE) ×1
GLOVE BIOGEL PI INDICATOR 7.0 (GLOVE) ×2
GLOVE BIOGEL PI INDICATOR 7.5 (GLOVE) ×2
GLOVE BIOGEL PI INDICATOR 8 (GLOVE) ×2
GLOVE SS BIOGEL STRL SZ 7 (GLOVE) IMPLANT
GLOVE SUPERSENSE BIOGEL SZ 7 (GLOVE) ×1
GLOVE SURG SS PI 7.0 STRL IVOR (GLOVE) ×2 IMPLANT
GLOVE SURG SS PI 7.5 STRL IVOR (GLOVE) ×2 IMPLANT
GOWN PREVENTION PLUS XXLARGE (GOWN DISPOSABLE) ×2 IMPLANT
GOWN STRL NON-REIN LRG LVL3 (GOWN DISPOSABLE) ×6 IMPLANT
GOWN STRL REIN XL XLG (GOWN DISPOSABLE) ×2 IMPLANT
GRAFT HEMASHIELD 14X7MM (Vascular Products) ×1 IMPLANT
HEMOSTAT SNOW SURGICEL 2X4 (HEMOSTASIS) IMPLANT
INSERT FOGARTY 61MM (MISCELLANEOUS) ×2 IMPLANT
INSERT FOGARTY SM (MISCELLANEOUS) ×4 IMPLANT
KIT BASIN OR (CUSTOM PROCEDURE TRAY) ×2 IMPLANT
KIT ROOM TURNOVER OR (KITS) ×2 IMPLANT
NS IRRIG 1000ML POUR BTL (IV SOLUTION) ×4 IMPLANT
PACK AORTA (CUSTOM PROCEDURE TRAY) ×2 IMPLANT
PAD ARMBOARD 7.5X6 YLW CONV (MISCELLANEOUS) ×4 IMPLANT
PUNCH AORTIC ROTATE 5MM 8IN (MISCELLANEOUS) ×1 IMPLANT
SUT ETHIBOND 5 LR DA (SUTURE) ×2 IMPLANT
SUT PDS AB 1 TP1 54 (SUTURE) ×4 IMPLANT
SUT PROLENE 3 0 SH 48 (SUTURE) ×11 IMPLANT
SUT PROLENE 3 0 SH DA (SUTURE) ×1 IMPLANT
SUT PROLENE 5 0 C 1 24 (SUTURE) ×2 IMPLANT
SUT PROLENE 5 0 C 1 36 (SUTURE) ×1 IMPLANT
SUT PROLENE 6 0 BV (SUTURE) ×1 IMPLANT
SUT SILK 2 0 (SUTURE) ×4
SUT SILK 2 0 TIES 17X18 (SUTURE) ×2
SUT SILK 2 0SH CR/8 30 (SUTURE) ×2 IMPLANT
SUT SILK 2-0 18XBRD TIE 12 (SUTURE) IMPLANT
SUT SILK 2-0 18XBRD TIE BLK (SUTURE) ×1 IMPLANT
SUT SILK 3 0 (SUTURE) ×2
SUT SILK 3 0 TIES 17X18 (SUTURE) ×2
SUT SILK 3-0 18XBRD TIE 12 (SUTURE) IMPLANT
SUT SILK 3-0 18XBRD TIE BLK (SUTURE) ×1 IMPLANT
SUT VIC AB 2-0 CT1 27 (SUTURE) ×14
SUT VIC AB 2-0 CT1 TAPERPNT 27 (SUTURE) ×6 IMPLANT
SUT VIC AB 3-0 SH 27 (SUTURE) ×8
SUT VIC AB 3-0 SH 27X BRD (SUTURE) ×4 IMPLANT
SUT VICRYL 4-0 PS2 18IN ABS (SUTURE) ×8 IMPLANT
SYR BULB IRRIGATION 50ML (SYRINGE) ×1 IMPLANT
TOWEL BLUE STERILE X RAY DET (MISCELLANEOUS) ×4 IMPLANT
TOWEL OR 17X24 6PK STRL BLUE (TOWEL DISPOSABLE) ×4 IMPLANT
TOWEL OR 17X26 10 PK STRL BLUE (TOWEL DISPOSABLE) ×4 IMPLANT
TRAY FOLEY CATH 16FRSI W/METER (SET/KITS/TRAYS/PACK) ×2 IMPLANT
WATER STERILE IRR 1000ML POUR (IV SOLUTION) ×4 IMPLANT

## 2013-05-25 NOTE — Interval H&P Note (Signed)
History and Physical Interval Note:  05/25/2013 7:05 AM  Max [name]  has presented today for surgery, with the diagnosis of Abdominal Aortic Aneurysm Iliac Occlusive Disease  The various methods of treatment have been discussed with the patient and family. After consideration of risks, benefits and other options for treatment, the patient has consented to  Procedure(s): AORTA BIFEMORAL BYPASS GRAFT (N/A) as a surgical intervention .  The patient's history has been reviewed, patient examined, no change in status, stable for surgery.  I have reviewed the patient's chart and labs.  Questions were answered to the patient's satisfaction.     Hyde Sires IV, V. WELLS

## 2013-05-25 NOTE — Anesthesia Postprocedure Evaluation (Signed)
Anesthesia Post Note  Patient: Max [name]  Procedure(s) Performed: Procedure(s) (LRB): AORTA BIFEMORAL BYPASS GRAFT (N/A)  Anesthesia type: General  Patient location: PACU  Post pain: Pain level controlled and Adequate analgesia  Post assessment: Post-op Vital signs reviewed, Patient's Cardiovascular Status Stable, Respiratory Function Stable, Patent Airway and Pain level controlled  Last Vitals:  Filed Vitals:   05/25/13 1245  BP:   Pulse: 84  Temp:   Resp: 14    Post vital signs: Reviewed and stable  Level of consciousness: awake, alert  and oriented  Complications: No apparent anesthesia complications

## 2013-05-25 NOTE — Op Note (Signed)
Patient name: Max [name] MRN: [id number] DOB: [birth year] Sex: male  05/25/2013 Pre-operative Diagnosis: #1: Bilateral claudication, left greater than right     #2: Abdominal aortic aneurysm Post-operative diagnosis:  Same Surgeon:  Jorge Ny Assistants:  Narda Amber, PA Procedure:   Aortobifemoral bypass graft Anesthesia:  Gen. Blood Loss:  See anesthesia record Specimens:  None  Findings:  I used a 14 x 7 bifurcated dacryon graft.  The proximal anastomosis was end to end.  The right femoral anastomosis was down onto the proximal profunda femoral artery.  The left femoral anastomosis was to the distal common femoral artery. There was pulsatile blood flow from the inferior mesenteric artery, therefore I did not reimplant it.  Oblique incisions were used in each groin.  Indications:  The patient has a history of bilateral iliac stenting.  He has had multiple interventions in the past.  His left iliac stent is now occluded.  He also has a 3.8 cm infrarenal abdominal aortic aneurysm.  I thoroughly discussed regarding her treatment options, we have elected to proceed with aortobifemoral bypass graft in order to treat both his claudication as well as his aneurysm.  Procedure:  The patient was identified in the holding area and taken to [facility] OR ROOM 11  The patient was then placed supine on the table. general anesthesia was administered.  The patient was prepped and draped in the usual sterile fashion.  A time out was called and antibiotics were administered.  Oblique incisions were made in each groin below the inguinal ligament.  Bovie cautery was used to divide the subcutaneous tissue down to the femoral sheath.  The femoral sheath was then opened sharply.  Bilateral common femoral arteries were then dissected free.  I individually isolated the superficial femoral and profunda femoral artery on the right.  I exposed the distal common femoral artery on the left.  Next, crossing iliac  vein branches were divided under the inguinal ligament between 2-0 silk ties.  The groins were then packed with moist Ray-Tec.  Attention was then turned towards the abdomen.  A midline incision was made from the xiphoid down to just below the umbilicus.  Cautery was used to divide the subcutaneous tissue down to the fascia which was opened with cautery.  I then sharply entered the peritoneal cavity and extended the in opening throughout the length of the incision.  The abdomen was then inspected.  There is no gross pathology.  A Balfour retractor was placed followed by the Omni tract retractor.  The transverse colon was reflected cephalad and the small bowel mobilized to the patient's right, and incorporated within a wet towel.  The ligament of Treitz was taken down sharply.  I then exposed the infrarenal abdominal aorta.  The patient has aneurysmal aorta and a normal proximal neck below the renal arteries.  I dissected out the proximal neck and encircled it with an umbilical tape.  I then isolated the inferior mesenteric artery and exposed the aortic bifurcation.  I then created a tunnel directly anterior to each iliac artery down to the groin.  I made sure I stayed posterior to the ureters.  At this point the patient was fully heparinized.  After the heparin had circulated a Hartmann clamp was used to occlude the aorta just below the renal arteries.  A DeBakey clamp was used to occlude the distal aorta at the bifurcation.  A #11 blade was used to make an aortotomy which was extended  longitudinally with curved Mayo scissors.  I transected the aorta approximately 1/2 cm below the clamp.  I also transected it below the inferior mesenteric artery.  The distal and was then ligated using a running 3-0 Prolene, and 2 layers.  I also used a Ethibond to ligate the aorta just proximal to the bifurcation.  I then selected a 14 x 7 dacryon graft.  A and to end anastomosis was performed proximally.  I used a felt strip.  3  horizontal mattress sutures were used for the posterior portion of the graft.  The remaining portion of the graft and aorta were created with a running suture, incorporating the felt strip.  The proximal clamp was released.  One repair stitch was required for hemostasis.  There was excellent flow through the graft.  Each limb of the graft was then pulled through the respective tunnels.  I performed the right femoral anastomosis first.  The femoral artery was occluded proximally and distally, superficial femoral and profunda femoral clamps were placed.  A #11 blade was used to make an arteriotomy which was extended longitudinally with Potts scissors.  I did extend the arteriotomy down approximately 2 mm onto the profunda femoral artery.  The graft was cut to the appropriate length and spatulated to fit the size of the arteriotomy.  A running anastomosis was created with 5-0 Prolene.  Prior to completion, the appropriate flushing maneuvers were performed and the anastomosis was completed.  Blood flow was then reestablished to the right leg.  Attention was then turned towards the left groin.  Left femoral artery was occluded proximally and distally with vascular clamps.  A #11 blade was used to make an arteriotomy which was extended longitudinally with Potts scissors.  The graft was cut the appropriate length and bevel to fit the size of the arteriotomy.  A running anastomosis was created with 5-0 Prolene.  Prior to completion, the appropriate flushing maneuvers were performed and the anastomosis was completed.  Blood flow was reestablished to the left leg.  Hand-held Doppler was used to evaluate the signals in each profunda femoral and superficial femoral artery.  All had excellent graft dependent signals.  I then went back to evaluate the inferior mesenteric artery.  I had originally planned to reimplant this.  I transected the artery.  There was pulsatile back bleeding.  I tried to open up the artery but it was  very diseased on the inside.  After struggling for why I elected not to reimplant it because of the excellent backbleeding.  At this point the patient heparin was reversed with 50 mg protamine.  Once I was satisfied with hemostasis, the retroperitoneum was reapproximated with a running 2-0 Vicryl.  The fascia was then closed with 2 running #1 PDS suture.  Subcutaneous tissues closed with 2-0 Vicryl and skin was closed with 4-0 Vicryl.  And the groins, the femoral sheath was reapproximated with 2-0 Vicryl.  The subcutaneous tissue was then reapproximated with multiple layers of 3-0 Vicryl, and the skin was closed with 4-0 Vicryl.  Dermabond placed on all incisions.  The patient had palpable pedal pulses at the end of the procedure.  There were no immediate complications.   Disposition:  To PACU in stable condition.   Juleen China, M.D. [facility] [place] Office: [phone] Pager:  [phone]

## 2013-05-25 NOTE — Transfer of Care (Signed)
Immediate Anesthesia Transfer of Care Note  Patient: Max [name]  Procedure(s) Performed: Procedure(s): AORTA BIFEMORAL BYPASS GRAFT (N/A)  Patient Location: PACU  Anesthesia Type:General  Level of Consciousness: patient cooperative and lethargic  Airway & Oxygen Therapy: Patient Spontanous Breathing and Patient connected to face mask oxygen  Post-op Assessment: Report given to PACU RN, Post -op Vital signs reviewed and stable and Patient moving all extremities X 4  Post vital signs: Reviewed and stable  Complications: No apparent anesthesia complications

## 2013-05-25 NOTE — Anesthesia Preprocedure Evaluation (Addendum)
Anesthesia Evaluation  Patient identified by MRN, date of birth, ID band Patient awake    Reviewed: Allergy & Precautions, H&P , NPO status , Patient's Chart, lab work & pertinent test results, reviewed documented beta blocker date and time   Airway Mallampati: II TM Distance: >3 FB Neck ROM: full    Dental  (+) Dental Advidsory Given   Pulmonary asthma , former smoker,          Cardiovascular hypertension, On Medications + CAD, + Past MI, + Cardiac Stents and + Peripheral Vascular Disease     Neuro/Psych CVA    GI/Hepatic   Endo/Other  diabetes  Renal/GU Renal hypertensionRenal disease     Musculoskeletal   Abdominal   Peds  Hematology   Anesthesia Other Findings   Reproductive/Obstetrics                          Anesthesia Physical Anesthesia Plan  ASA: III  Anesthesia Plan: General   Post-op Pain Management:    Induction: Intravenous  Airway Management Planned: Oral ETT  Additional Equipment: Arterial line  Intra-op Plan:   Post-operative Plan: Extubation in OR  Informed Consent: I have reviewed the patients History and Physical, chart, labs and discussed the procedure including the risks, benefits and alternatives for the proposed anesthesia with the patient or authorized representative who has indicated his/her understanding and acceptance.   Dental Advisory Given  Plan Discussed with: Anesthesiologist, CRNA and Surgeon  Anesthesia Plan Comments:        Anesthesia Quick Evaluation

## 2013-05-25 NOTE — H&P (View-Only) (Signed)
Patient name: Max [name] MRN: [id number] DOB: 05/23/1956 Sex: male     Chief Complaint  Patient presents with  . Follow-up    Discuss CTA ches, Abd/pelvis    HISTORY OF PRESENT ILLNESS: The patient is back today for discussions regarding bilateral claudication, left greater than right and a small bowel aortic aneurysm.  I initially saw him in September for similar issues.  He has a history of bilateral iliac stenting in 2000 and [facility].  He has had multiple stents placed in bilateral iliac arteries, most recently this was studied and performed by Dr. [name].  He was found to have an occluded left iliac stent which could not be recanalized.  At that time, he did not feel his symptoms were severe enough to warrant revascularization.  He was also having difficulty with his blood pressure, being on the high side.  This has now been corrected and with a lower blood pressures, he is having worsening symptoms in his legs and wishes to have something done about this.  The patient has had a low risk nuclear study in 2013.  He is on a statin for hypercholesterolemia.  He also is medically managed for his hypertension.  He has a atretic right kidney.  Past Medical History  Diagnosis Date  . CAD (coronary artery disease), native coronary artery 2001    PCI RCA, low risk Nuc 11/13  . LV dysfunction 03/2012    Echo -  EF 45-50%, mod Conc LVH, mid Inf HK  . Normal cardiac stress test 04/07/2012    Lexiscan myoview; EF 45%, no ischemia or infarct  . Hyperlipidemia 04/07/2012  . PAD (peripheral artery disease) 12/07/12    with stent-bil. iliac, RCIA PTA 12/07/12  . Back pain, chronic     multiple back surgeries   . AAA (abdominal aortic aneurysm)     mild aneurysmal dilatation  . Near syncope 03/2012    Event monitor - PACs, PVCs - no arrhythmia  . Hypertension   . Heart murmur     "outgrew it" (01/19/2013)  . MI (myocardial infarction) 2001    at [facility] PCI - RCA  .  Asthma   . Pneumonia     "numerous times; last time ~ a year or 2 ago" (01/19/2013)  . Chronic bronchitis     "q couple years or so" (01/19/2013)  . Stroke 2014    "little one; little numbness and tingling in left thumb and left facial expressions not the same since" (01/19/2013)  . Basal cell cancer   . Right renal artery stenosis 12/07/12    Rt RAS  . Kidney stone   . Nonfunctioning kidney     "right kidney has atrophied to 1/3 size of the left" (01/19/2013)  . DM (diabetes mellitus) type II controlled peripheral vascular disorder     no med since 6/13 lost unexplained weight  . Arthritis     Past Surgical History  Procedure Laterality Date  . Coronary angioplasty with stent placement  2001    to RCA @ [facility]  . Lumbar laminectomy  1995, 2000,2001    L5-S1  . Iliac artery stent Bilateral     "I have 6 total" (01/19/2013)  . Iliac artery stent Right 12/07/2012    Dr [name]  . Nasal sinus surgery    . Lithotripsy      "twice I think" (01/19/2013)  . Abdominal aortagram  01/19/2013    unsuccessful attempt at crossing a moderately  long segment calcified proximal left common iliac artery Hattie Perch 01/19/2013  . Basal cell carcinoma excision      "numerous; all over my face, arms, back" (01/19/2013)    History   Social History  . Marital Status: Single    Spouse Name: N/A    Number of Children: 0  . Years of Education: N/A   Occupational History  .  [employer]   Social History Main Topics  . Smoking status: Former Smoker -- 1.50 packs/day for 40 years    Types: Cigarettes    Quit date: 07/26/2012  . Smokeless tobacco: Former Neurosurgeon    Types: Chew     Comment: 01/19/2013 "quit chewing 25-30 yr ago"  . Alcohol Use: No  . Drug Use: No  . Sexual Activity: Not Currently   Other Topics Concern  . Not on file   Social History Narrative   He is a single gentleman. He swims several days a week.  He is now almost a year out from IV sedation were no longer using nicotine in his  electronic cigarette.   Occasional alcohol beverage.   He works as a Curator.    Family History  Problem Relation Age of Onset  . Heart failure Mother     died @ 70  . Heart attack Father     died @ 38  . Stroke Paternal Grandmother   . Heart failure Maternal Grandfather     Allergies as of 05/24/2013 - Review Complete 05/17/2013  Allergen Reaction Noted  . Carvedilol Other (See Comments) 12/24/2012    Current Outpatient Prescriptions on File Prior to Visit  Medication Sig Dispense Refill  . albuterol (PROVENTIL HFA;VENTOLIN HFA) 108 (90 BASE) MCG/ACT inhaler Inhale 2 puffs into the lungs every 6 (six) hours as needed for wheezing or shortness of breath.       . ALPRAZolam (XANAX) 0.5 MG tablet Take 0.5 mg by mouth every evening. Take 1 tablet in morning and 2 tablet at bedtime ( Per pt 1/2 tablet at bedtime only)      . amLODipine (NORVASC) 10 MG tablet Take 10 mg by mouth every evening.       [name] aspirin 81 MG EC tablet Take 81 mg by mouth every morning.       [name] atorvastatin (LIPITOR) 80 MG tablet Take 80 mg by mouth every morning.       . clopidogrel (PLAVIX) 75 MG tablet Take 75 mg by mouth every morning.       . folic acid (FOLVITE) 1 MG tablet Take 1 mg by mouth daily. Take 2 tablet in the morning      . HYDROcodone-acetaminophen (NORCO/VICODIN) 5-325 MG per tablet Take 1 tablet by mouth every 4 (four) hours as needed for pain.       [name] KLOR-CON M10 10 MEQ tablet Take 2 tablets by mouth daily.       [name] lisinopril (PRINIVIL,ZESTRIL) 20 MG tablet Take 20 mg by mouth 2 (two) times daily.      . nitroGLYCERIN (NITROSTAT) 0.4 MG SL tablet Place 1 tablet (0.4 mg total) under the tongue every 5 (five) minutes as needed for chest pain (take for severe chest pressure ot tightness).  25 tablet  2  . tamsulosin (FLOMAX) 0.4 MG CAPS capsule Take 0.4 mg by mouth daily after supper.      . valsartan-hydrochlorothiazide (DIOVAN-HCT) 320-25 MG per tablet Take 1 tablet by mouth daily.      .  hydrALAZINE (APRESOLINE)  25 MG tablet Take 1 tablet by mouth 2 (two) times daily.       No current facility-administered medications on file prior to visit.     REVIEW OF SYSTEMS: Please see history of present illness, otherwise all systems are unchanged from prior visit  PHYSICAL EXAMINATION:   Vital signs are BP 160/93  Pulse 60  Resp 14  Ht 5\' 8"  (1.727 m)  Wt 126 lb (57.153 kg)  BMI 19.16 kg/m2  SpO2 98% General: The patient appears their stated age. HEENT:  No gross abnormalities Pulmonary:  Non labored breathing Abdomen: Soft and non-tender Musculoskeletal: There are no major deformities. Neurologic: No focal weakness or paresthesias are detected, Skin: There are no ulcer or rashes noted. Psychiatric: The patient has normal affect. Cardiovascular: There is a regular rate and rhythm without significant murmur appreciated.   Diagnostic Studies CT angiogram was reviewed by myself today.  This shows occluded left iliac stent.  Bilateral femoral arteries are widely patent.  He has a 3.7 cm infrarenal abdominal aortic aneurysm  Assessment: Abdominal aortic aneurysm and claudication Plan: I discussed to potential surgical options to address both the aneurysm as well as the claudication.  The first would be an aortobifemoral bypass graft.  The second would be aortoiliac stenting and right to left femoral-femoral bypass graft.  After a lengthy, well informed discussion, the patient has requested to proceed with an aortobifemoral bypass graft.  The risks and benefits were discussed extensively with him and all of his questions were answered.  He has been off of his Plavix for at least one week.  His surgery is scheduled for tomorrow  V. Charlena Cross, M.D. [facility] [place] Office: [phone] Pager:  [phone]

## 2013-05-25 NOTE — Progress Notes (Signed)
Pt received 8mg  morphine since 1900. After administrations pt falls deeply asleep. During time period pt refuses deep breathing and turning. Md notified orders received.

## 2013-05-25 NOTE — Anesthesia Procedure Notes (Signed)
Procedure Name: Intubation Date/Time: 05/25/2013 7:35 AM Performed by: Carmela Rima Pre-anesthesia Checklist: Patient identified, Timeout performed, Suction available, Emergency Drugs available and Patient being monitored Patient Re-evaluated:Patient Re-evaluated prior to inductionOxygen Delivery Method: Circle system utilized Preoxygenation: Pre-oxygenation with 100% oxygen Intubation Type: IV induction Ventilation: Mask ventilation without difficulty and Oral airway inserted - appropriate to patient size Laryngoscope Size: Mac and 3 Grade View: Grade I Tube type: Oral Tube size: 7.0 mm Number of attempts: 1 Placement Confirmation: positive ETCO2,  ETT inserted through vocal cords under direct vision and breath sounds checked- equal and bilateral Secured at: 23 cm Tube secured with: Tape Dental Injury: Teeth and Oropharynx as per pre-operative assessment

## 2013-05-25 NOTE — Progress Notes (Signed)
Report given to David RN.

## 2013-05-25 NOTE — Progress Notes (Signed)
Max [name] had short 3-5 second episodes of pSVT that spontaneously resolves.

## 2013-05-25 NOTE — Progress Notes (Signed)
Utilization review completed. Markala Sitts, RN, BSN. 

## 2013-05-25 NOTE — Preoperative (Signed)
Beta Blockers   Reason not to administer Beta Blockers:Not Applicable 

## 2013-05-26 ENCOUNTER — Inpatient Hospital Stay (HOSPITAL_COMMUNITY): Payer: Federal, State, Local not specified - PPO

## 2013-05-26 ENCOUNTER — Encounter (HOSPITAL_COMMUNITY): Payer: Self-pay | Admitting: Surgery

## 2013-05-26 LAB — COMPREHENSIVE METABOLIC PANEL
ALT: 16 U/L (ref 0–53)
Alkaline Phosphatase: 48 U/L (ref 39–117)
CO2: 26 mEq/L (ref 19–32)
Chloride: 105 mEq/L (ref 96–112)
Creatinine, Ser: 0.95 mg/dL (ref 0.50–1.35)
GFR calc Af Amer: >90 mL/min (ref >90)
Glucose, Bld: 130 mg/dL — ABNORMAL HIGH (ref 70–99)
Potassium: 4.8 mEq/L (ref 3.7–5.3)
Sodium: 141 mEq/L (ref 137–147)
Total Bilirubin: 0.4 mg/dL (ref 0.3–1.2)
Total Protein: 5.4 g/dL — ABNORMAL LOW (ref 6.0–8.3)

## 2013-05-26 LAB — CBC
HCT: 36.4 % — ABNORMAL LOW (ref 39.0–52.0)
Hemoglobin: 12.5 g/dL — ABNORMAL LOW (ref 13.0–17.0)
MCHC: 34.3 g/dL (ref 30.0–36.0)
RBC: 4.09 MIL/uL — ABNORMAL LOW (ref 4.22–5.81)
RDW: 13.6 % (ref 11.5–15.5)
WBC: 20.5 10*3/uL — ABNORMAL HIGH (ref 4.0–10.5)

## 2013-05-26 LAB — MAGNESIUM: Magnesium: 1.9 mg/dL (ref 1.5–2.5)

## 2013-05-26 MED ORDER — SODIUM CHLORIDE 0.9 % IJ SOLN: 9.0000 mL | INTRAMUSCULAR | Status: DC | PRN

## 2013-05-26 MED ORDER — ASPIRIN 81 MG PO CHEW
81.0000 mg | CHEWABLE_TABLET | Freq: Once | ORAL | Status: AC
  Administered 2013-05-26: 81 mg via ORAL
  Filled 2013-05-26: qty 1

## 2013-05-26 MED ORDER — KETOROLAC TROMETHAMINE 30 MG/ML IJ SOLN
30.0000 mg | Freq: Three times a day (TID) | INTRAMUSCULAR | Status: AC
  Administered 2013-05-26 (×3): 30 mg via INTRAVENOUS
  Filled 2013-05-26 (×3): qty 1

## 2013-05-26 MED ORDER — HYDROMORPHONE 0.3 MG/ML IV SOLN
INTRAVENOUS | Status: DC
  Administered 2013-05-26: 3.6 mg via INTRAVENOUS
  Administered 2013-05-26: 3.3 mg via INTRAVENOUS
  Administered 2013-05-26: 2.7 mg via INTRAVENOUS
  Administered 2013-05-26 (×2): via INTRAVENOUS
  Administered 2013-05-26: 1.8 mg via INTRAVENOUS
  Administered 2013-05-26: 10:00:00 via INTRAVENOUS
  Administered 2013-05-26 – 2013-05-27 (×2): 2.1 mg via INTRAVENOUS
  Administered 2013-05-27: 0.9 mg via INTRAVENOUS
  Administered 2013-05-27 (×3): 2.4 mg via INTRAVENOUS
  Administered 2013-05-27: 2.3 mg via INTRAVENOUS
  Administered 2013-05-27: 2.7 mg via INTRAVENOUS
  Administered 2013-05-27: 23:00:00 via INTRAVENOUS
  Administered 2013-05-28: 3 mg via INTRAVENOUS
  Administered 2013-05-28: 1.8 mg via INTRAVENOUS
  Filled 2013-05-26 (×7): qty 25

## 2013-05-26 MED ORDER — DIAZEPAM 5 MG/ML IJ SOLN: 2.5000 mg | Freq: Four times a day (QID) | INTRAMUSCULAR | Status: DC | PRN

## 2013-05-26 MED ORDER — LORAZEPAM 2 MG/ML IJ SOLN: 0.5000 mg | Freq: Once | INTRAMUSCULAR | Status: DC

## 2013-05-26 MED ORDER — ENOXAPARIN SODIUM 30 MG/0.3ML ~~LOC~~ SOLN
30.0000 mg | Freq: Two times a day (BID) | SUBCUTANEOUS | Status: DC
  Administered 2013-05-26 – 2013-05-30 (×9): 30 mg via SUBCUTANEOUS
  Filled 2013-05-26 (×13): qty 0.3

## 2013-05-26 MED ORDER — LORAZEPAM BOLUS VIA INFUSION
0.5000 mg | Freq: Once | INTRAVENOUS | Status: DC
  Filled 2013-05-26: qty 1

## 2013-05-26 MED ORDER — NALOXONE HCL 0.4 MG/ML IJ SOLN: 0.4000 mg | INTRAMUSCULAR | Status: DC | PRN

## 2013-05-26 MED ORDER — ONDANSETRON HCL 4 MG/2ML IJ SOLN: 4.0000 mg | Freq: Four times a day (QID) | INTRAMUSCULAR | Status: DC | PRN

## 2013-05-26 MED ORDER — DIPHENHYDRAMINE HCL 50 MG/ML IJ SOLN: 12.5000 mg | Freq: Four times a day (QID) | INTRAMUSCULAR | Status: DC | PRN

## 2013-05-26 MED ORDER — DIPHENHYDRAMINE HCL 12.5 MG/5ML PO ELIX
12.5000 mg | ORAL_SOLUTION | Freq: Four times a day (QID) | ORAL | Status: DC | PRN
  Filled 2013-05-26: qty 5

## 2013-05-26 NOTE — Progress Notes (Signed)
OT Cancellation Note  Patient Details Name: Max [name] MRN: [id number] DOB: [birth year]   Cancelled Treatment:    Reason Eval/Treat Not Completed: Fatigue/lethargy limiting ability to participate - Attempted x 2.  Pt had just finished with PT, and upon return, pt had just returned to bed.  Will reattempt  Jeani Hawking M [phone] 05/26/2013, 11:38 AM

## 2013-05-26 NOTE — Progress Notes (Signed)
Dilaudid PCA syringe change, 3ml waste in sink/remaining volume. Armida Sans RN witness waste in sink. Max [name]

## 2013-05-26 NOTE — Progress Notes (Signed)
E Collins PA updated pt status at bedside. Dilaudid PCA started overnight for pain management, pt appears more comfortable, rating pain at 5 range. Updated aline BP significantly higher than cuff, positional aline. PA ok to d/c aline, d/c swan, leave introducer in place. PA ok to advance activity as pt tolerates. Will continue NGT/NPO status for now. Koren Bound

## 2013-05-26 NOTE — Progress Notes (Signed)
INITIAL NUTRITION ASSESSMENT  DOCUMENTATION CODES Per approved criteria  -Non-severe (moderate) malnutrition in the context of chronic illness   INTERVENTION: Advance diet as medically appropriate, add supplements when/as able RD to follow for nutrition care plan  NUTRITION DIAGNOSIS: Inadequate oral intake related to inability to eat as evidenced by NPO status  Goal: Pt to meet >/= 90% of their estimated nutrition needs   Monitor:  PO diet advancement & intake, weight, labs, I/O's  Reason for Assessment: Malnutrition Screening Tool Report  57 y.o. male  Admitting Dx: abdominal aortic aneurysm  ASSESSMENT: Patient with PMH of CAD and DM; presented with pre-op diagnosis of bilateral claudication and AAA.  Patient s/p procedure 12/30: AORTOBIFEMORAL BYPASS GRAFT  Patient on nasal cannula upon RD visit; seemed uncomfortable in his chair; reports he was consuming 2 meals per day with 6 Ensure supplements per day PTA; also states he's had a 67 lb weight loss in the past year; visible moderate muscle loss to upper body; currently NPO; RD to monitor diet advancement.  Per weight readings, patient had had a 12% weight loss since May 2014.  Patient meets criteria for moderate (non-severe) malnutrition in the context of chronic illness as evidenced by 12% weight loss x 7 months and moderate muscle loss (clavicles, deltoids, pectoralis).  Height: Ht Readings from Last 1 Encounters:  05/25/13 5' 8.11" (1.73 m)    Weight: Wt Readings from Last 1 Encounters:  05/26/13 136 lb 14.5 oz (62.1 kg)    Ideal Body Weight: 154 lb  % Ideal Body Weight: 88%  Wt Readings from Last 30 Encounters:  05/26/13 136 lb 14.5 oz (62.1 kg)  05/26/13 136 lb 14.5 oz (62.1 kg)  05/24/13 126 lb (57.153 kg)  05/17/13 125 lb (56.7 kg)  03/15/13 140 lb (63.504 kg)  03/10/13 142 lb 3.2 oz (64.501 kg)  02/04/13 137 lb (62.143 kg)  01/20/13 129 lb 6.6 oz (58.7 kg)  01/20/13 129 lb 6.6 oz (58.7 kg)   12/24/12 146 lb 4.8 oz (66.361 kg)  12/07/12 136 lb 4.8 oz (61.825 kg)  12/07/12 136 lb 4.8 oz (61.825 kg)  11/18/12 142 lb 6.4 oz (64.592 kg)  11/11/12 145 lb (65.772 kg)  10/26/12 148 lb 9.6 oz (67.405 kg)  10/12/12 155 lb 9.6 oz (70.58 kg)  04/08/12 154 lb 5.2 oz (70 kg)    Usual Body Weight: 155 lb  % Usual Body Weight: 88%  BMI:  Body mass index is 20.75 kg/(m^2).  Estimated Nutritional Needs: Kcal: 1700-1900 Protein: 80-90 gm Fluid: 1.7-1.9 L  Skin: surgical incision to groin & abdomen  Diet Order: NPO  EDUCATION NEEDS: -No education needs identified at this time   Intake/Output Summary (Last 24 hours) at 05/26/13 1213 Last data filed at 05/26/13 1100  Gross per 24 hour  Intake 2450.33 ml  Output   1600 ml  Net 850.33 ml    Labs:   Recent Labs Lab 05/25/13 1039 05/25/13 1046 05/25/13 1155 05/26/13 0445  NA 139  --  139 141  K 3.7  --  4.8 4.8  CL  --   --  101 105  CO2  --   --  26 26  BUN  --   --  12 12  CREATININE  --   --  0.79 0.95  CALCIUM  --   --  8.4 7.9*  MG  --   --  1.8 1.9  GLUCOSE  --  105* 151* 130*    CBG (last 3)  Recent Labs  05/25/13 0611 05/25/13 1148  GLUCAP 97 122*    Scheduled Meds: . ALPRAZolam  0.5 mg Oral QPM  . amLODipine  10 mg Oral QPM  . aspirin EC  81 mg Oral q morning - 10a  . atorvastatin  80 mg Oral q1800  . clopidogrel  75 mg Oral Q breakfast  . docusate sodium  100 mg Oral Daily  . enoxaparin (LOVENOX) injection  30 mg Subcutaneous Q12H  . folic acid  1 mg Oral Daily  . hydrALAZINE  25 mg Oral BID  . valsartan  320 mg Oral Daily   And  . hydrochlorothiazide  25 mg Oral Daily  . HYDROmorphone PCA 0.3 mg/mL   Intravenous Q4H  . ketorolac  30 mg Intravenous Q8H  . lisinopril  20 mg Oral BID  . LORazepam  0.5 mg Intravenous Once  . pantoprazole  40 mg Oral Daily  . potassium chloride  20 mEq Oral Daily  . tamsulosin  0.4 mg Oral QPC supper    Continuous Infusions: . dextrose 5 % and 0.9 %  NaCl with KCl 20 mEq/L 125 mL/hr (05/26/13 0855)  . DOPamine      Past Medical History  Diagnosis Date  . CAD (coronary artery disease), native coronary artery 2001    PCI RCA, low risk Nuc 11/13  . LV dysfunction 03/2012    Echo -  EF 45-50%, mod Conc LVH, mid Inf HK  . Normal cardiac stress test 04/07/2012    Lexiscan myoview; EF 45%, no ischemia or infarct  . Hyperlipidemia 04/07/2012  . PAD (peripheral artery disease) 12/07/12    with stent-bil. iliac, RCIA PTA 12/07/12  . Back pain, chronic     multiple back surgeries   . AAA (abdominal aortic aneurysm)     mild aneurysmal dilatation  . Near syncope 03/2012    Event monitor - PACs, PVCs - no arrhythmia  . Hypertension   . Heart murmur     "outgrew it" (01/19/2013)  . MI (myocardial infarction) 2001    at [facility]; PCI - RCA  . Asthma   . Pneumonia     "numerous times; last time ~ a year or 2 ago" (01/19/2013)  . Chronic bronchitis     "q couple years or so" (01/19/2013)  . Stroke 2014    "little one; little numbness and tingling in left thumb and left facial expressions not the same since" (01/19/2013)  . Basal cell cancer   . Right renal artery stenosis 12/07/12    Rt RAS  . Kidney stone   . Nonfunctioning kidney     "right kidney has atrophied to 1/3 size of the left" (01/19/2013)  . DM (diabetes mellitus) type II controlled peripheral vascular disorder     no med since 6/13 lost unexplained weight  . Arthritis     Past Surgical History  Procedure Laterality Date  . Coronary angioplasty with stent placement  2001    to RCA @ [facility]  . Lumbar laminectomy  1995, 2000,2001    L5-S1  . Iliac artery stent Bilateral     "I have 6 total" (01/19/2013)  . Iliac artery stent Right 12/07/2012    Dr [name]  . Nasal sinus surgery    . Lithotripsy      "twice I think" (01/19/2013)  . Abdominal aortagram  01/19/2013    unsuccessful attempt at crossing a moderately long segment calcified proximal left common iliac artery  Hattie Perch 01/19/2013  . Basal  cell carcinoma excision      "numerous; all over my face, arms, back" (01/19/2013)    Maureen Chatters, RD, LDN Pager #: [phone] After-Hours Pager #: [phone]

## 2013-05-26 NOTE — Evaluation (Signed)
Physical Therapy Evaluation Patient Details Name: ROLLIE HYNEK MRN: [id number] DOB: 18-Jan-1956 Today's Date: 05/26/2013 Time: 0950-1015 PT Time Calculation (min): 25 min  PT Assessment / Plan / Recommendation History of Present Illness  The patient is back today for discussions regarding bilateral claudication, left greater than right and a small bowel aortic aneurysm.  I initially saw him in September for similar issues.  He has a history of bilateral iliac stenting in 2000 and [facility].  He has had multiple stents placed in bilateral iliac arteries, most recently this was studied and performed by Dr. [name].  He was found to have an occluded left iliac stent which could not be recanalized.  At that time, he did not feel his symptoms were severe enough to warrant revascularization.  He was also having difficulty with his blood pressure, being on the high side.  This has now been corrected and with a lower blood pressures, he is having worsening symptoms in his legs and wishes to have something done about this.  Clinical Impression  Pt presents with weakness and a lot of incisional pain.  Expect him to make steady progress and reach independent level prior to D/C.  Pt does not have assist at home.  Hopefully he can round up someone to check on him prn.    PT Assessment  Patient needs continued PT services    Follow Up Recommendations  Home health PT;Supervision - Intermittent    Does the patient have the potential to tolerate intense rehabilitation      Barriers to Discharge Decreased caregiver support      Equipment Recommendations  Other (comment) (TBA)    Recommendations for Other Services     Frequency Min 3X/week    Precautions / Restrictions Precautions Precautions: Fall Restrictions Weight Bearing Restrictions: No   Pertinent Vitals/Pain Vitals stable; extreme pain per pt.      Mobility  Bed Mobility Bed Mobility: Supine to Sit;Sitting - Scoot to Edge of  Bed Supine to Sit: 4: Min assist;With rails;HOB elevated Sitting - Scoot to [place] of Bed: 4: Min guard Details for Bed Mobility Assistance: cuing for safe/less painful technique Transfers Transfers: Sit to Stand;Stand to [employer] Transfers Sit to Stand: 4: Min assist;From bed;With upper extremity assist Stand to Sit: 4: Min assist;To chair/3-in-1 Stand Pivot Transfers: 4: Min assist Details for Transfer Assistance: vc's for safety/least painful method; minimal lift assist Ambulation/Gait Ambulation/Gait Assistance: Not tested (comment) Stairs: No Wheelchair Mobility Wheelchair Mobility: No    Exercises     PT Diagnosis: Generalized weakness;Acute pain  PT Problem List: Decreased strength;Pain;Decreased knowledge of precautions PT Treatment Interventions: Gait training;Functional mobility training;Therapeutic activities;Patient/family education;DME instruction;Stair training     PT Goals(Current goals can be found in the care plan section) Acute Rehab PT Goals Patient Stated Goal: possibly back to work PT Goal Formulation: With patient Time For Goal Achievement: 06/09/13 Potential to Achieve Goals: Good  Visit Information  Last PT Received On: 05/26/13 Assistance Needed: +2 (lines tubes) History of Present Illness: The patient is back today for discussions regarding bilateral claudication, left greater than right and a small bowel aortic aneurysm.  I initially saw him in September for similar issues.  He has a history of bilateral iliac stenting in 2000 and [facility].  He has had multiple stents placed in bilateral iliac arteries, most recently this was studied and performed by Dr. [name].  He was found to have an occluded left iliac stent which could not be recanalized.  At that time, he did not feel his symptoms were severe enough to warrant revascularization.  He was also having difficulty with his blood pressure, being on the high side.  This has now been corrected and  with a lower blood pressures, he is having worsening symptoms in his legs and wishes to have something done about this.       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 Communication Communication: No difficulties    Cognition  Cognition Arousal/Alertness: Awake/alert Behavior During Therapy: Agitated;Anxious Overall Cognitive Status: Within Functional Limits for tasks assessed    Extremity/Trunk Assessment Upper Extremity Assessment Upper Extremity Assessment: Defer to OT evaluation Lower Extremity Assessment Lower Extremity Assessment: Generalized weakness (not tested formally due to pain) Cervical / Trunk Assessment Cervical / Trunk Assessment: Normal   Balance Balance Balance Assessed: Yes Static Sitting Balance Static Sitting - Balance Support: Right upper extremity supported;Left upper extremity supported;Feet supported Static Sitting - Level of Assistance: 5: Stand by assistance Static Sitting - Comment/# of Minutes: 5  End of Session PT - End of Session Activity Tolerance: Patient tolerated treatment well Patient left: in chair;with call bell/phone within reach;with nursing/sitter in room Nurse Communication: Mobility status  GP     Voris Tigert, Eliseo Gum 05/26/2013, 11:47 AM  05/26/2013  Craig Bing, PT [phone]  (pager)

## 2013-05-26 NOTE — Progress Notes (Addendum)
[facility] [place]  Subjective  - Pain issues.  PCA started last night seems a little better.  He states it just does not last long enough.     Objective 146/72 86 99.5 F (37.5 C) (Core (Comment)) 14 96%  Intake/Output Summary (Last 24 hours) at 05/26/13 0740 Last data filed at 05/26/13 0600  Gross per 24 hour  Intake 5800.33 ml  Output   2130 ml  Net 3670.33 ml    Abdomin is very tender to motion  + BS per nursing, minimal Distally bilateral feet well perfused Lungs clear  Heart RRR   Assessment/Planning: POD #1 Aortobifemoral bypass graft BP fluctuates with pain level per nursing staff over night.  No medication given for BP control.  120/73-169/86.  Arterial line reading does not correspond to BP cuff. .  D/C arterial line.  Pulse 87. D/C swan and keep inducer. D/C NG tube, may have ice chips  Valium 2.5 PRN for muscle relaxer and Toradol 30 mg q 6 prn times 3 doses to help with pain control. NG tube output 100 since surgery. WBC 16.5 now 20.5  Temp max 100.6 currently 99.5. IS at bed side and has been used. I will discuss further plan of care with Dr. Diamond Nickel, [facility] 05/26/2013 7:40 AM --  Laboratory Lab Results:  Recent Labs  05/25/13 1155 05/26/13 0445  WBC 16.5* 20.5*  HGB 12.5* 12.5*  HCT 35.3* 36.4*  PLT 149* 127*   BMET  Recent Labs  05/25/13 1155 05/26/13 0445  NA 139 141  K 4.8 4.8  CL 101 105  CO2 26 26  GLUCOSE 151* 130*  BUN 12 12  CREATININE 0.79 0.95  CALCIUM 8.4 7.9*    COAG Lab Results  Component Value Date   INR 1.23 05/25/2013   INR 1.09 05/17/2013   INR 0.95 01/18/2013   No results found for this basename: PTT

## 2013-05-26 NOTE — Progress Notes (Signed)
Dr Myra Gianotti updated pt status. MD updated with AM assessment, unable to successfully auscultate NGT. Reviewed AM xray report and per report, NGT in mid esophagus. With AM rounds, MD stated to crush ASA x1 down NGT and then d/c NGT and pt may have ice chips, otherwise hold PO meds. Inquired if MD wanted to advance NGT and get xray to confirm repositioning for ASA or if ok to pull NGT and let pt chew aspirin. MD stated ok for pt to chew ASA and d/c NGT as ordered. Will continue to monitor. Koren Bound

## 2013-05-26 NOTE — Progress Notes (Signed)
    Subjective  - POD #1, status post aortobifemoral bypass graft  The patient reports significant pain overnight.  He denies flatus.   Physical Exam:  Midline incision is clean dry and intact.  His abdomen is scaphoid and soft but tender.  His femoral incisions are healing nicely.  He has a palpable pedal pulses bilaterally.       Assessment/Plan:  POD #1  Acute blood loss anemia: The patient's hemoglobin and hematocrit remained stable.  There are no indications for transfusion at this time.  Diabetes: This is diet controlled as an outpatient.  He'll be covered with sliding scale as needed an inpatient.  GI: The patient NG tube will be removed today.  I will let him have ice chips will keep him n.p.o. until he passes flatus.  Activity: The patient will be up ad lib.  Prophylaxis: Initiate Lovenox   Hypercholesterolemia: I will restart the patient's patent once he is taking by mouth.  Pain: I started the patient on a PCA last night.  He is still having trouble with pain control.  He was given Toradol today.  He only has one kidney so I will need to pay close attention to his renal function  Emelie Newsom IV, V. WELLS 05/26/2013 10:02 AM --  Filed Vitals:   05/26/13 0900  BP: 153/74  Pulse: 86  Temp: 99.5 F (37.5 C)  Resp: 14    Intake/Output Summary (Last 24 hours) at 05/26/13 1002 Last data filed at 05/26/13 0900  Gross per 24 hour  Intake 3500.33 ml  Output   1840 ml  Net 1660.33 ml     Laboratory CBC    Component Value Date/Time   WBC 20.5* 05/26/2013 0445   HGB 12.5* 05/26/2013 0445   HCT 36.4* 05/26/2013 0445   PLT 127* 05/26/2013 0445    BMET    Component Value Date/Time   NA 141 05/26/2013 0445   K 4.8 05/26/2013 0445   CL 105 05/26/2013 0445   CO2 26 05/26/2013 0445   GLUCOSE 130* 05/26/2013 0445   BUN 12 05/26/2013 0445   CREATININE 0.95 05/26/2013 0445   CREATININE 1.07 03/19/2013 1042   CALCIUM 7.9* 05/26/2013 0445   GFRNONAA >90  05/26/2013 0445   GFRAA >90 05/26/2013 0445    COAG Lab Results  Component Value Date   INR 1.23 05/25/2013   INR 1.09 05/17/2013   INR 0.95 01/18/2013   No results found for this basename: PTT    Antibiotics Anti-infectives   Start     Dose/Rate Route Frequency Ordered Stop   05/25/13 1500  cefUROXime (ZINACEF) 1.5 g in dextrose 5 % 50 mL IVPB     1.5 g 100 mL/hr over 30 Minutes Intravenous Every 12 hours 05/25/13 1352 05/26/13 0316   05/24/13 1410  cefUROXime (ZINACEF) 1.5 g in dextrose 5 % 50 mL IVPB     1.5 g 100 mL/hr over 30 Minutes Intravenous 30 min pre-op 05/24/13 1410 05/25/13 0751       V. Charlena Cross, M.D. [facility] [place] Office: [phone] Pager:  [phone]

## 2013-05-27 DIAGNOSIS — E440 Moderate protein-calorie malnutrition: Secondary | ICD-10-CM | POA: Insufficient documentation

## 2013-05-27 LAB — BASIC METABOLIC PANEL
BUN: 14 mg/dL (ref 6–23)
CO2: 23 mEq/L (ref 19–32)
Calcium: 8.1 mg/dL — ABNORMAL LOW (ref 8.4–10.5)
Chloride: 110 mEq/L (ref 96–112)
Creatinine, Ser: 0.87 mg/dL (ref 0.50–1.35)
GFR calc Af Amer: >90 mL/min (ref >90)
GFR calc non Af Amer: >90 mL/min (ref >90)
Glucose, Bld: 124 mg/dL — ABNORMAL HIGH (ref 70–99)
Potassium: 5 mEq/L (ref 3.7–5.3)
Sodium: 142 mEq/L (ref 137–147)

## 2013-05-27 LAB — CBC
HCT: 32.4 % — ABNORMAL LOW (ref 39.0–52.0)
Hemoglobin: 10.7 g/dL — ABNORMAL LOW (ref 13.0–17.0)
MCH: 30.1 pg (ref 26.0–34.0)
MCHC: 33 g/dL (ref 30.0–36.0)
MCV: 91.3 fL (ref 78.0–100.0)
Platelets: 98 10*3/uL — ABNORMAL LOW (ref 150–400)
RBC: 3.55 MIL/uL — ABNORMAL LOW (ref 4.22–5.81)
RDW: 13.9 % (ref 11.5–15.5)
WBC: 19.6 10*3/uL — ABNORMAL HIGH (ref 4.0–10.5)

## 2013-05-27 NOTE — Accreditation Note (Signed)
0950 Transferred to 2W bed 8 via WC and monitor, NT is room to receive. SCD's on

## 2013-05-27 NOTE — Progress Notes (Signed)
Attempted to call report to 2W 

## 2013-05-27 NOTE — Progress Notes (Signed)
   VASCULAR PROGRESS NOTE  SUBJECTIVE: He has passed flatus. No BM. No nausea. Hungry.  PHYSICAL EXAM: Filed Vitals:   05/27/13 0400 05/27/13 0500 05/27/13 0600 05/27/13 0700  BP: 129/69 147/74 129/66 139/71  Pulse: 84 91 84 84  Temp: 98.5 F (36.9 C)     TempSrc: Oral     Resp: 11 14 10 9   Height:      Weight:   141 lb 5 oz (64.1 kg)   SpO2: 97% 98% 99% 98%   Lungs clear bilaterally to auscultation. Abdomen with normal pitched bowel sounds.  Mild cellulitis left groin Palpable dorsalis pedis pulses.  LABS: Lab Results  Component Value Date   WBC 19.6* 05/27/2013   HGB 10.7* 05/27/2013   HCT 32.4* 05/27/2013   MCV 91.3 05/27/2013   PLT 98* 05/27/2013   Lab Results  Component Value Date   CREATININE 0.95 05/26/2013   Lab Results  Component Value Date   INR 1.23 05/25/2013   CBG (last 3)   Recent Labs  05/25/13 0611 05/25/13 1148  GLUCAP 97 122*   Active Problems:   AAA (abdominal aortic aneurysm)   Malnutrition of moderate degree  ASSESSMENT AND PLAN:  * 2 Days Post-Op s/p: Aortofemoral bypass graft  *  Mild cellulitis of the left groin. Will start Ancef.  * GI/nutrition: Will start liquid diet today.  * Renal function remains normal.  * Transferred to 2000.   * Discontinue right IJ central line.   * Discontinue Foley.   * Ambulate.  Gae Gallop Beeper: [phone] 05/27/2013

## 2013-05-27 NOTE — Progress Notes (Signed)
05/27/2013 6:01 PM Nursing note RN noted that during times when patient is sleeping respirations drop between 8-10 per minute immediately raising to 12-16 per minute when awake. Pt. Easily aroused. Oxygen saturation 97% on room air. Co2 35. 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 Dr. Scot Dock paged and made aware of findings. No new orders at this time only to continue to monitor patient. Orders enacted.  Javiana Anwar, Arville Lime

## 2013-05-27 NOTE — Progress Notes (Signed)
Report called to 2W RN 

## 2013-05-27 NOTE — Progress Notes (Signed)
PT Cancellation Note  Patient Details Name: Max [name] MRN: [id number] DOB: July 18, 1955   Cancelled Treatment:    Reason Eval/Treat Not Completed: Fatigue/lethargy limiting ability to participate;Medical issues which prohibited therapy. Attempted to have patient ambulate this afternoon however patient stated that he had been up several times to bathroom today and that he was exhausted. Patient politely declined therapy today and asked if we could return tomorrow,    Jacqualyn Posey 05/27/2013, 1:22 PM

## 2013-05-27 NOTE — Progress Notes (Signed)
05/27/2013 1300 Nursing note Pt. States he has voided several times since urinary catheter was removed this am. Pt. Encouraged to use urinal so that output could be accurately recorded. Will continue to monitor patient.  Lilliann Rossetti, Arville Lime

## 2013-05-28 MED ORDER — OXYCODONE HCL 5 MG PO TABS
5.0000 mg | ORAL_TABLET | ORAL | Status: DC | PRN
  Administered 2013-05-28 (×2): 10 mg via ORAL
  Filled 2013-05-28 (×2): qty 2

## 2013-05-28 MED ORDER — OXYCODONE HCL 5 MG PO TABS
5.0000 mg | ORAL_TABLET | ORAL | Status: DC | PRN
  Administered 2013-05-28 – 2013-05-30 (×6): 10 mg via ORAL
  Administered 2013-05-30: 5 mg via ORAL
  Administered 2013-05-30 – 2013-05-31 (×5): 10 mg via ORAL
  Filled 2013-05-28 (×12): qty 2

## 2013-05-28 NOTE — Progress Notes (Signed)
05/28/2013 1015 Pt. States he does not want the PCA dilaudid anymore. Pump turned off and pt. Given PRN oxy IR per orders. Gerri Lins [facility] paged and made aware. Orders received to d/c PCA orders per patient request as well as to saline lock IV fluids and hold this am dose of PO KCL. Orders enacted and patient updated on plan. Will continue to monitor.  Horst Ostermiller, Arville Lime

## 2013-05-28 NOTE — Evaluation (Signed)
Occupational Therapy Evaluation Patient Details Name: Max [name] MRN: [id number] DOB: 16-Jul-1955 Today's Date: 05/28/2013 Time: 9629-5284 OT Time Calculation (min): 20 min  OT Assessment / Plan / Recommendation History of present illness The patient has a history of bilateral iliac stenting.  He has had multiple interventions in the past.  His left iliac stent is now occluded.  He also has a 3.8 cm infrarenal abdominal aortic aneurysm. Patient elected to proceed with aortobifemoral bypass graft 05/25/13 in order to treat both his claudication as well as his aneurysm.   Clinical Impression   Patient is s/p aortobifemoral bypass graft surgery 05/25/13 resulting in functional limitations due to the deficits listed below (see OT Problem List).  Patient will benefit from skilled OT to increase their safety and independence with ADL and functional mobility for ADL to allow facilitate discharge to venue listed below.      OT Assessment  Patient needs continued OT Services    Follow Up Recommendations  Home health OT;Supervision - Intermittent    Barriers to Discharge Decreased caregiver support    Equipment Recommendations   (to be assessed)    Frequency  Min 2X/week    Precautions / Restrictions Precautions Precautions: Fall   Pertinent Vitals/Pain Reports abdominal pain, not rated, rest and repositioned    ADL  Toilet Transfer: Performed;Min guard Science writer: Regular height toilet;Grab bars Toileting - Clothing Manipulation and Hygiene: Performed;Supervision/safety (hosp gown) Where Assessed - Toileting Clothing Manipulation and Hygiene: Standing;Sit on 3-in-1 or toilet Transfers/Ambulation Related to ADLs: Patient using IV pole for stability during ambulation to/from bathroom.  Determined to do things his way and with fatigue and weakness, therefore recommending close supervision for all mobility.  Patient with minimal LOB ADL Comments: Patient declined to  stand at sink to wash hands after toileting or perform any grooming at sink, "I need to lay down".    OT Diagnosis: Generalized weakness;Acute pain  OT Problem List: Decreased strength;Decreased activity tolerance;Decreased safety awareness;Cardiopulmonary status limiting activity;Pain OT Treatment Interventions: Self-care/ADL training;Energy conservation;DME and/or AE instruction;Therapeutic activities   OT Goals(Current goals can be found in the care plan section) Acute Rehab OT Goals Patient Stated Goal: go home and take care of myself OT Goal Formulation: With patient Time For Goal Achievement: 06/11/13 Potential to Achieve Goals: Good ADL Goals Pt Will Perform Grooming: with modified independence;standing Pt Will Perform Upper Body Bathing: with modified independence;sitting Pt Will Perform Lower Body Bathing: with modified independence;sit to/from stand Pt Will Perform Lower Body Dressing: with modified independence;sit to/from stand Pt Will Transfer to Toilet: with modified independence;ambulating (DME PRN) Pt Will Perform Toileting - Clothing Manipulation and hygiene: with modified independence;sit to/from stand  Visit Information  Last OT Received On: 05/28/13 Assistance Needed: +1 History of Present Illness: The patient has a history of bilateral iliac stenting.  He has had multiple interventions in the past.  His left iliac stent is now occluded.  He also has a 3.8 cm infrarenal abdominal aortic aneurysm. Patient elected to proceed with aortobifemoral bypass graft 05/25/13 in order to treat both his claudication as well as his aneurysm.       Prior [place] expects to be discharged to:: Private residence Living Arrangements: Alone Available Help at Discharge: Other (Comment) (pt reports no one able to assist at discharge) Type of Home: House Home Access: Stairs to enter [employer] of Steps: 4 Entrance Stairs-Rails:  Right;Left Home Layout: One level Home Equipment: None Additional Comments:  Patient fatigued and declined to talk about bathroom today.  OT to continue this discussion regarding any recommendations related to DME Prior Function Level of Independence: Independent Communication Communication: No difficulties    Cognition  Cognition Arousal/Alertness: Awake/alert Behavior During Therapy: Agitated;Anxious Overall Cognitive Status: Within Functional Limits for tasks assessed    Extremity/Trunk Assessment Upper Extremity Assessment Upper Extremity Assessment: Overall WFL for tasks assessed;Generalized weakness Lower Extremity Assessment Lower Extremity Assessment: Defer to PT evaluation     Mobility Bed Mobility Bed Mobility: Supine to Sit;Sitting - Scoot to Edge of Bed Supine to Sit: With rails;HOB elevated;5: Supervision Sitting - Scoot to Edge of Bed: 5: Supervision Details for Bed Mobility Assistance: requesting assist with blankets  Transfers Sit to Stand: From bed;With upper extremity assist;4: Min guard;From toilet Stand to Sit: 4: Min guard;To toilet Details for Transfer Assistance: Patient using IV pole for stability during all mobility     End of Session OT - End of Session Activity Tolerance: Patient limited by fatigue;Treatment limited secondary to agitation;Patient limited by pain Patient left: in bed;with call bell/phone within reach   [facility], [place] 05/28/2013, 9:28 AM

## 2013-05-28 NOTE — Progress Notes (Addendum)
[facility] [place]  Subjective  - Pain is still and issue.  We will start PO pain medication today and try to wean off PCA today.   Objective 112/58 65 98.2 F (36.8 C) (Oral) 18 99%  Intake/Output Summary (Last 24 hours) at 05/28/13 0720 Last data filed at 05/27/13 1500  Gross per 24 hour  Intake    707 ml  Output     50 ml  Net    657 ml    Incisions clean and dry.   Cellulitis has subsided in left groin area. Feet are well perfused Abdomin soft ( positive flatus)    Assessment/Planning: POD# 3 s/p: Aortofemoral bypass graft   KVO fluids, add PO pain medication start to wean off PCA. Ambulate Maintain liquid diet    Theda Sers, EMMA [facility] 05/28/2013 7:20 AM --  Laboratory Lab Results:  Recent Labs  05/26/13 0445 05/27/13 0450  WBC 20.5* 19.6*  HGB 12.5* 10.7*  HCT 36.4* 32.4*  PLT 127* 98*   BMET  Recent Labs  05/26/13 0445 05/27/13 0450  NA 141 142  K 4.8 5.0  CL 105 110  CO2 26 23  GLUCOSE 130* 124*  BUN 12 14  CREATININE 0.95 0.87  CALCIUM 7.9* 8.1*    COAG Lab Results  Component Value Date   INR 1.23 05/25/2013   INR 1.09 05/17/2013   INR 0.95 01/18/2013   No results found for this basename: PTT      I agree with the above.  The patient is tolerating clear liquids.  He is passing flatus.  He is able to ablate in the halls.  He would continue to take a clear liquid diet.  Likely we'll be able to advance this tomorrow.  I will restart his oral home medications.  His PCA will be discontinued.  Annamarie Major

## 2013-05-28 NOTE — Progress Notes (Signed)
Physical Therapy Treatment Patient Details Name: Max [name] MRN: [id number] DOB: [birth year] Today's Date: 05/28/2013 Time: 1446-1510 PT Time Calculation (min): 24 min  PT Assessment / Plan / Recommendation  History of Present Illness The patient has a history of bilateral iliac stenting.  He has had multiple interventions in the past.  His left iliac stent is now occluded.  He also has a 3.8 cm infrarenal abdominal aortic aneurysm. Patient elected to proceed with aortobifemoral bypass graft 05/25/13 in order to treat both his claudication as well as his aneurysm.   PT Comments   Pt doing the bare minimum and when participates uses equipment (such as HOB up) that he is likely not to use at home, making it difficult to assess his needs.   Follow Up Recommendations  Home health PT;Other (comment) (Pt may end up needing SNF if he doesn't start moving)     Does the patient have the potential to tolerate intense rehabilitation     Barriers to Discharge        Equipment Recommendations  Other (comment) (TBA)    Recommendations for Other Services    Frequency Min 3X/week   Progress towards PT Goals Progress towards PT goals: Progressing toward goals (very slowly)  Plan      Precautions / Restrictions Precautions Precautions: Fall   Pertinent Vitals/Pain 7-8/10 incisional pain toward end of session,  Pain meds given     Mobility  Bed Mobility Bed Mobility: Supine to Sit;Sitting - Scoot to Edge of Bed Supine to Sit: With rails;HOB elevated;5: Supervision;Other (comment) (would need assist if HOB flat, but pt won't try) Sitting - Scoot to Edge of Bed: 5: Supervision Details for Bed Mobility Assistance: assist scooting up in bed by blocking his feet. Transfers Transfers: Sit to Stand;Stand to Sit Sit to Stand: 4: Min guard;From bed Stand to Sit: 4: Min guard;To bed Details for Transfer Assistance: min guard for safety Ambulation/Gait Ambulation/Gait Assistance: 4: Min  guard Ambulation Distance (Feet): 30 Feet (then 15 feet with rest on the toilet in between) Assistive device: None Ambulation/Gait Assistance Details: short, slow tentative steps.  pt likely could do more with assistive device, but pt doesn't want to use. Stairs: No    Exercises     PT Diagnosis:    PT Problem List:   PT Treatment Interventions:     PT Goals (current goals can now be found in the care plan section) Acute Rehab PT Goals Patient Stated Goal: go home and take care of myself PT Goal Formulation: With patient Time For Goal Achievement: 06/09/13 Potential to Achieve Goals: Good  Visit Information  Last PT Received On: 05/28/13 Assistance Needed: +1 History of Present Illness: The patient has a history of bilateral iliac stenting.  He has had multiple interventions in the past.  His left iliac stent is now occluded.  He also has a 3.8 cm infrarenal abdominal aortic aneurysm. Patient elected to proceed with aortobifemoral bypass graft 05/25/13 in order to treat both his claudication as well as his aneurysm.    Subjective Data  Patient Stated Goal: go home and take care of myself   Cognition  Cognition Arousal/Alertness: Awake/alert Behavior During Therapy: WFL for tasks assessed/performed (mildly agitated) Overall Cognitive Status: Within Functional Limits for tasks assessed    Balance  Balance Balance Assessed: No Static Sitting Balance Static Sitting - Balance Support: Bilateral upper extremity supported Static Sitting - Level of Assistance: 5: Stand by assistance  End of Session PT - End of  Session Activity Tolerance: Patient tolerated treatment well Patient left: in bed;with call bell/phone within reach Nurse Communication: Mobility status   GP     Dalisha Shively, Tessie Fass 05/28/2013, 3:32 PM 05/28/2013  Donnella Sham, PT [phone]  (pager)

## 2013-05-28 NOTE — Progress Notes (Signed)
05/28/2013 5:45 PM Nursing note  After ambulation in hallway,pt. C/o severe pain. Current PRN oxycodone regimen ordered for Q4H PRN. Dr. Kellie Simmering paged and made aware of pt. Request for stronger pain medication. Verbal orders received to increase frequency of ordered oxycodone IR to every 3 hours PRN. Orders enacted. Pt. Updated on plan of care. Will continue to monitor patient.  Havilah Topor, Arville Lime'

## 2013-05-29 LAB — CBC
HEMATOCRIT: 30 % — AB (ref 39.0–52.0)
Hemoglobin: 10.2 g/dL — ABNORMAL LOW (ref 13.0–17.0)
MCH: 30.4 pg (ref 26.0–34.0)
MCHC: 34 g/dL (ref 30.0–36.0)
MCV: 89.3 fL (ref 78.0–100.0)
Platelets: 115 10*3/uL — ABNORMAL LOW (ref 150–400)
RBC: 3.36 MIL/uL — ABNORMAL LOW (ref 4.22–5.81)
RDW: 13 % (ref 11.5–15.5)
WBC: 9.7 10*3/uL (ref 4.0–10.5)

## 2013-05-29 LAB — BASIC METABOLIC PANEL
BUN: 10 mg/dL (ref 6–23)
CALCIUM: 8.6 mg/dL (ref 8.4–10.5)
CO2: 27 mEq/L (ref 19–32)
Chloride: 99 mEq/L (ref 96–112)
Creatinine, Ser: 0.87 mg/dL (ref 0.50–1.35)
GFR calc Af Amer: >90 mL/min (ref >90)
GLUCOSE: 105 mg/dL — AB (ref 70–99)
Potassium: 4.2 mEq/L (ref 3.7–5.3)
Sodium: 134 mEq/L — ABNORMAL LOW (ref 137–147)

## 2013-05-29 MED ORDER — SODIUM CHLORIDE 0.9 % IJ SOLN
3.0000 mL | Freq: Two times a day (BID) | INTRAMUSCULAR | Status: DC
  Administered 2013-05-29 – 2013-05-30 (×3): 3 mL via INTRAVENOUS

## 2013-05-29 NOTE — Progress Notes (Signed)
Pt tolerated ambulation in hallway 1+ assistance, gait fairly steady, ambulated approx 150 feet, pt returned to room to bathroom Max [name]

## 2013-05-29 NOTE — Progress Notes (Signed)
Ambulated 200 feet in hallway with 1+ assist gait steady tolerated well. Passing flatus, states no BM yet Joylene Draft A

## 2013-05-29 NOTE — Progress Notes (Addendum)
[facility] [place]  Subjective  - "My pain pills were missed this morning at 6 am and now the pain is bad again.  I was asleep."   Objective 117/58 70 98.9 F (37.2 C) (Oral) 18 96%  Intake/Output Summary (Last 24 hours) at 05/29/13 0859 Last data filed at 05/29/13 0827  Gross per 24 hour  Intake    960 ml  Output      0 ml  Net    960 ml    Feet are warm to touch and well perfused. Incisions are clean and dry. Abdomin soft with pos. BS Heart RRR  Assessment/Planning: POD # 4 Pain is still an issue Will advance his diet to heart healthy today Ambulate and keep scheduled PO pain medication q 3 hours.   [name] [facility] 05/29/2013 8:59 AM --  Laboratory Lab Results:  Recent Labs  05/27/13 0450 05/29/13 0425  WBC 19.6* 9.7  HGB 10.7* 10.2*  HCT 32.4* 30.0*  PLT 98* 115*   BMET  Recent Labs  05/27/13 0450  NA 142  K 5.0  CL 110  CO2 23  GLUCOSE 124*  BUN 14  CREATININE 0.87  CALCIUM 8.1*    COAG Lab Results  Component Value Date   INR 1.23 05/25/2013   INR 1.09 05/17/2013   INR 0.95 01/18/2013   No results found for this basename: PTT    Agree with above assessment Abdominal incision and angle incisions healing nicely with 3+ femoral pulses bilaterally. Pain management is still an issue-on oral analgesics Ambulating somewhat Advance to regular diet today  Plan continue to increase ambulation and discharge home on Monday

## 2013-05-30 NOTE — Progress Notes (Signed)
Patient ID: Max [name], male   DOB: 13-Jan-1956, 58 y.o.   MRN: [id number] Vascular Surgery Progress Note  Subjective: 5 days post aorta bifemoral bypass grafting. Patient continues to complain of intermittent pain and states he does not get his pain medication frequently enough. His scheduled every 3 hours when necessary. Has had bowel movement and is passing flatus. He is ambulating in the hall.  Objective:  Filed Vitals:   05/30/13 0541  BP: 117/69  Pulse: 87  Temp: 98.7 F (37.1 C)  Resp: 18    General alert and oriented x3 no apparent stress Abdomen not distended or particularly tender no rebound Both feet adequately perfused and incisions healing satisfactorily   Labs:  Recent Labs Lab 05/26/13 0445 05/27/13 0450 05/29/13 1130  CREATININE 0.95 0.87 0.87    Recent Labs Lab 05/26/13 0445 05/27/13 0450 05/29/13 1130  NA 141 142 134*  K 4.8 5.0 4.2  CL 105 110 99  CO2 26 23 27   BUN 12 14 10   CREATININE 0.95 0.87 0.87  GLUCOSE 130* 124* 105*  CALCIUM 7.9* 8.1* 8.6    Recent Labs Lab 05/26/13 0445 05/27/13 0450 05/29/13 0425  WBC 20.5* 19.6* 9.7  HGB 12.5* 10.7* 10.2*  HCT 36.4* 32.4* 30.0*  PLT 127* 98* 115*    Recent Labs Lab 05/25/13 1155  INR 1.23    I/O last 3 completed shifts: In: 720 [P.O.:720] Out: -   Imaging: No results found.  Assessment/Plan:  POD #5  LOS: 5 days  s/p Procedure(s): AORTA BIFEMORAL BYPASS GRAFT  Progressing satisfactorily following aortobifemoral bypass grafting 5 days ago with moderate amount of postoperative pain  Plan-continue to ambulate today and hopefully the patient will be ready for DC home in a.m.-per Dr. Ronnald Collum, MD 05/30/2013 9:05 AM

## 2013-05-30 NOTE — Progress Notes (Signed)
Tolerated ambulation in hallway with 1+ assistance, gait steady 300 feet Joylene Draft A

## 2013-05-31 ENCOUNTER — Telehealth: Payer: Self-pay | Admitting: Surgery

## 2013-05-31 MED ORDER — OXYCODONE HCL 5 MG PO TABS: 5.0000 mg | ORAL_TABLET | ORAL | Status: AC | PRN

## 2013-05-31 NOTE — Discharge Summary (Signed)
[facility] Discharge Summary   Patient ID:  Max [name] MRN: [id number] DOB/AGE: 1955/08/13 58 y.o.  Admit date: 05/25/2013 Discharge date: 05/31/2013 Date of Surgery: 05/25/2013 Surgeon: Surgeon(s): Nada Libman, MD  Admission Diagnosis: Abdominal Aortic Aneurysm Iliac Occlusive Disease  Discharge Diagnoses:  Abdominal Aortic Aneurysm Iliac Occlusive Disease  Secondary Diagnoses: Past Medical History  Diagnosis Date  . CAD (coronary artery disease), native coronary artery 2001    PCI RCA, low risk Nuc 11/13  . LV dysfunction 03/2012    Echo -  EF 45-50%, mod Conc LVH, mid Inf HK  . Normal cardiac stress test 04/07/2012    Lexiscan myoview; EF 45%, no ischemia or infarct  . Hyperlipidemia 04/07/2012  . PAD (peripheral artery disease) 12/07/12    with stent-bil. iliac, RCIA PTA 12/07/12  . Back pain, chronic     multiple back surgeries   . AAA (abdominal aortic aneurysm)     mild aneurysmal dilatation  . Near syncope 03/2012    Event monitor - PACs, PVCs - no arrhythmia  . Hypertension   . Heart murmur     "outgrew it" (01/19/2013)  . MI (myocardial infarction) 2001    at [facility]; PCI - RCA  . Asthma   . Pneumonia     "numerous times; last time ~ a year or 2 ago" (01/19/2013)  . Chronic bronchitis     "q couple years or so" (01/19/2013)  . Stroke 2014    "little one; little numbness and tingling in left thumb and left facial expressions not the same since" (01/19/2013)  . Basal cell cancer   . Right renal artery stenosis 12/07/12    Rt RAS  . Kidney stone   . Nonfunctioning kidney     "right kidney has atrophied to 1/3 size of the left" (01/19/2013)  . DM (diabetes mellitus) type II controlled peripheral vascular disorder     no med since 6/13 lost unexplained weight  . Arthritis     Procedure(s): AORTA BIFEMORAL BYPASS GRAFT  Discharged Condition: good  HPI: The patient came in for discussions regarding bilateral  claudication, left greater than right and a small bowel aortic aneurysm. I initially saw him in September for similar issues. He has a history of bilateral iliac stenting in 2000 and [facility]. He has had multiple stents placed in bilateral iliac arteries, most recently this was studied and performed by Dr. [name]. He was found to have an occluded left iliac stent which could not be recanalized. At that time, he did not feel his symptoms were severe enough to warrant revascularization. He was also having difficulty with his blood pressure, being on the high side. This has now been corrected and with a lower blood pressures, he is having worsening symptoms in his legs and wishes to have something done about this.  The patient has had a low risk nuclear study in 2013. He is on a statin for hypercholesterolemia. He also is medically managed for his hypertension. He has a atretic right kidney.  His CT angiogram was reviewed by Dr. Myra Gianotti.  This shows occluded left iliac stent. Bilateral femoral arteries are widely patent. He has a 3.7 cm infrarenal abdominal aortic aneurysm.     He underwent Aortobifemoral bypass graft on 05/25/2013.  He was admitted to 2300 post-op for close observation.   POD# 1 Aortobifemoral bypass graft  BP fluctuates with pain level per nursing staff over night. No medication given for BP control. 120/73-169/86. Arterial  line reading does not correspond to BP cuff. . D/C arterial line. Pulse 87.  D/C swan and keep inducer.  D/C NG tube, may have ice chips  PCA was started last night Valium 2.5 PRN for muscle relaxer and Toradol 30 mg q 6 prn times 3 doses to help with pain control.  NG tube output 100 since surgery.  WBC 16.5 now 20.5 Temp max 100.6 currently 99.5.  IS at bed side and has been used. Prophylaxis: Initiate Lovenox  POD# 2 Transferred to 2000, mild cellulitis in the left groin was discovered which resolved within 24 hours without intervention..  Clean dry dressing  was applied.  Right IJ central line was D/C'd as well as foley.  Liquid diet was started ans ambulation with PT.  Pain was his primary issue.  We were able to D/C the PCA and started Oxycodone 5-10 mg PO q3.  Advanced his diet to heart healthy.  POD # 6 he was in stable condition, ambulating with straight cane in halls, and pain was well controlled.  He was tolerating PO's well.  He was discharged home today.     Hospital Course:  Max [name] is a 58 y.o. male is S/P Neither Procedure(s): AORTA BIFEMORAL BYPASS GRAFT Extubated: POD # 0 Physical exam: Palpable DP pulses bilateral  Incisions clean and dry without hematoma.  Positive bowel sounds  Post-op wounds healing well Pt. Ambulating, voiding and taking PO diet without difficulty. Pt pain controlled with PO pain meds. Labs as below Complications:see HPI  Consults:     Significant Diagnostic Studies: CBC Lab Results  Component Value Date   WBC 9.7 05/29/2013   HGB 10.2* 05/29/2013   HCT 30.0* 05/29/2013   MCV 89.3 05/29/2013   PLT 115* 05/29/2013    BMET    Component Value Date/Time   NA 134* 05/29/2013 1130   K 4.2 05/29/2013 1130   CL 99 05/29/2013 1130   CO2 27 05/29/2013 1130   GLUCOSE 105* 05/29/2013 1130   BUN 10 05/29/2013 1130   CREATININE 0.87 05/29/2013 1130   CREATININE 1.07 03/19/2013 1042   CALCIUM 8.6 05/29/2013 1130   GFRNONAA >90 05/29/2013 1130   GFRAA >90 05/29/2013 1130   COAG Lab Results  Component Value Date   INR 1.23 05/25/2013   INR 1.09 05/17/2013   INR 0.95 01/18/2013     Disposition:  Discharge to :Home Discharge Orders   Future Appointments Provider Department Dept Phone   03/14/2014 9:00 AM [facility] [place] [phone]   Eat a light meal the night before the exam Nothing to eat or drink for at least 8 hours before exam No gum chewing, or smoking the morning of the exam. Please take your morning medications with small sips of water, especially blood pressure  medication *Very Important* Please wear 2 piece clothing   03/14/2014 9:30 AM Serafina Mitchell, MD [facility] [phone]   Future Orders Complete By Expires   Call MD for:  redness, tenderness, or signs of infection (pain, swelling, bleeding, redness, odor or green/yellow discharge around incision site)  As directed    Call MD for:  severe or increased pain, loss or decreased feeling  in affected limb(s)  As directed    Call MD for:  temperature >100.5  As directed    Driving Restrictions  As directed    Comments:     No driving for 3 weeks   Increase activity slowly  As directed    Comments:     Walk with assistance use walker or cane as needed   Lifting restrictions  As directed    Comments:     No lifting for 6 weeks   May shower   As directed    Resume previous diet  As directed        Medication List    STOP taking these medications       HYDROcodone-acetaminophen 5-325 MG per tablet  Commonly known as:  NORCO/VICODIN      TAKE these medications       albuterol 108 (90 BASE) MCG/ACT inhaler  Commonly known as:  PROVENTIL HFA;VENTOLIN HFA  Inhale 2 puffs into the lungs every 6 (six) hours as needed for wheezing or shortness of breath.     ALPRAZolam 0.5 MG tablet  Commonly known as:  XANAX  Take 0.5 mg by mouth every evening. Take 1 tablet in morning and 2 tablet at bedtime ( Per pt 1/2 tablet at bedtime only)     amLODipine 10 MG tablet  Commonly known as:  NORVASC  Take 10 mg by mouth every evening.     aspirin 81 MG EC tablet  Take 81 mg by mouth every morning.     atorvastatin 80 MG tablet  Commonly known as:  LIPITOR  Take 80 mg by mouth every morning.     clopidogrel 75 MG tablet  Commonly known as:  PLAVIX  Take 75 mg by mouth every morning.     folic acid 1 MG tablet  Commonly known as:  FOLVITE  Take 1 mg by mouth daily. Take 2 tablet in the morning     hydrALAZINE 25 MG tablet  Commonly known as:  APRESOLINE   Take 1 tablet by mouth 2 (two) times daily.     KLOR-CON M10 10 MEQ tablet  Generic drug:  potassium chloride  Take 2 tablets by mouth daily.     lisinopril 20 MG tablet  Commonly known as:  PRINIVIL,ZESTRIL  Take 20 mg by mouth 2 (two) times daily.     nitroGLYCERIN 0.4 MG SL tablet  Commonly known as:  NITROSTAT  Place 1 tablet (0.4 mg total) under the tongue every 5 (five) minutes as needed for chest pain (take for severe chest pressure ot tightness).     oxyCODONE 5 MG immediate release tablet  Commonly known as:  Oxy IR/ROXICODONE  Take 1-2 tablets (5-10 mg total) by mouth every 4 (four) hours as needed for severe pain (we will try to wean him off the PCA).     tamsulosin 0.4 MG Caps capsule  Commonly known as:  FLOMAX  Take 0.4 mg by mouth daily after supper.     valsartan-hydrochlorothiazide 320-25 MG per tablet  Commonly known as:  DIOVAN-HCT  Take 1 tablet by mouth daily.       Verbal and written Discharge instructions given to the patient. Wound care per Discharge AVS     Follow-up Information   Follow up with Trula Slade IV, [place], MD In 3 weeks. (sent)    Specialty:  Vascular Surgery   Contact information:   [address] [place] [address] [phone]       Signed: [name] [facility] 05/31/2013, 11:35 AM  - For VQI Registry use --- Instructions: Press F2 to tab through selections.  Delete question if not applicable.   Post-op:  Time to Extubation: [x ] In OR, [ ]  <12 hrs, [ ]  12-24  hrs, [ ]  > 24 hrs Vasopressors: No  ICU Stay: 1 days  Transfusion: No  If yes, 0 units given New Arrhythmia: No Ipsilateral amputation: [x ] no, [ ]  Minor, [ ]  BKA, [ ]  AKA Discharge patency: [x ] Primary, [ ]  Primary assisted, [ ]  Secondary, [ ]  Occluded Patency judged by: [ ]  Dopper only, [x ] Palpable graft pulse, [x ] Palpable distal pulse, [ ]  ABI inc. > 0.15, [ ]  Duplex Discharge ABI: Discharge TBI:  D/C Ambulatory Status:  Ambulatory  Complications: Wound complication: [ ]  No, [x ] Superficial, [ ]  Return to OR  Graft infection: No  Leg ischemia/emboli: [x ] No, [ ]  Yes, no Surgery, [ ]  Yes, Surgery req., [ ]  Amputation If amputation: side: [ ]  R: [ ]  minor, [ ]  BKA, [ ]  AKA; [ ]  L: [ ]  Minor, [ ]  BKA, [ ]  AKA  MI: [x ] No, [ ]  Troponin only, [ ]  EKG or Clinical CHF: No Resp failure: [x ] none, [ ]  Pneumonia, [ ]  Ventilator Chg in renal function: [x ] none, [ ]  Inc. Cr > 0.5, [ ]  Temp. Dialysis, [ ]  Permanent dialysis Stroke: [x ] None, [ ]  Minor, [ ]  Major Return to OR: No  Reason for return to OR: [ ]  Bleeding, [ ]  Infection, [ ]  Thrombosis, [ ]  Revision  Discharge medications: Statin use:  Yes ASA use:  Yes Plavix use:  Yes Beta blocker use: No  for medical reason   Coumadin use: No  for medical reason

## 2013-05-31 NOTE — Progress Notes (Signed)
IV and tele monitor d/c at this time; pt to d/c home alone; friend to pick pt up from hospital; pt awaiting cane to be delivered to room; will cont. To monitor.

## 2013-05-31 NOTE — Telephone Encounter (Addendum)
Message copied by Gena Fray on Mon May 31, 2013  3:58 PM ------      Message from: [place], [place] M      Created: Mon May 31, 2013  7:24 AM       F/U with Dr. Trula Slade in 3 weeks s/p Aorta by-hem by-pass graft. ------  05/31/13: spoke with pt to schedule, pt expressed his gratitude towards Dr Trula Slade for the care that he gave. He said that Dr Trula Slade was "spot on" and very knowledgeable. He said that he would recommend Dr Trula Slade to anyone with the same issues.

## 2013-05-31 NOTE — Progress Notes (Signed)
[facility] [place]  Subjective  -" I think I'm ready to go home.  I do not need home PT."   Objective 97/62 85 98.6 F (37 C) (Oral) 18 93%  Intake/Output Summary (Last 24 hours) at 05/31/13 0728 Last data filed at 05/30/13 1500  Gross per 24 hour  Intake    720 ml  Output      0 ml  Net    720 ml    Palpable DP pulses bilateral Incisions clean and dry without hematoma. Positive bowel sounds    Assessment/Planning: POD #6  AORTA BIFEMORAL BYPASS GRAFT D/C home on oxycodone  F/U in 3 weeks with dr. Fae Pippin, Latoya Diskin [facility] 05/31/2013 7:28 AM --  Laboratory Lab Results:  Recent Labs  05/29/13 0425  WBC 9.7  HGB 10.2*  HCT 30.0*  PLT 115*   BMET  Recent Labs  05/29/13 1130  NA 134*  K 4.2  CL 99  CO2 27  GLUCOSE 105*  BUN 10  CREATININE 0.87  CALCIUM 8.6    COAG Lab Results  Component Value Date   INR 1.23 05/25/2013   INR 1.09 05/17/2013   INR 0.95 01/18/2013   No results found for this basename: PTT

## 2013-06-01 MED FILL — Heparin Sodium (Porcine) Inj 1000 Unit/ML: INTRAMUSCULAR | Qty: 30 | Status: AC

## 2013-06-01 MED FILL — Sodium Chloride IV Soln 0.9%: INTRAVENOUS | Qty: 1000 | Status: AC

## 2013-06-04 NOTE — Discharge Summary (Signed)
I agree with the above  Wells Dray Dente 

## 2013-06-11 ENCOUNTER — Other Ambulatory Visit: Payer: Self-pay | Admitting: Physician Assistant

## 2013-06-11 ENCOUNTER — Other Ambulatory Visit: Payer: Self-pay | Admitting: Cardiology

## 2013-06-14 NOTE — Telephone Encounter (Signed)
Rx was sent to pharmacy electronically. 

## 2013-06-15 ENCOUNTER — Telehealth: Payer: Self-pay | Admitting: *Deleted

## 2013-06-15 NOTE — Telephone Encounter (Signed)
Patient called requesting a prescription for a straight handle walking cane. He states that the one he was given makes him trip.  I called [facility] in [place], [place] and spoke to [place] and gave the order for one.  Patient was called and informed that they do have a prescription for one and he voiced understanding of the information.

## 2013-06-18 ENCOUNTER — Encounter: Payer: Self-pay | Admitting: Surgery

## 2013-06-21 ENCOUNTER — Encounter: Payer: Federal, State, Local not specified - PPO | Admitting: Surgery

## 2013-06-25 ENCOUNTER — Encounter: Payer: Self-pay | Admitting: Surgery

## 2013-06-28 ENCOUNTER — Encounter: Payer: Self-pay | Admitting: Surgery

## 2013-06-28 ENCOUNTER — Ambulatory Visit (INDEPENDENT_AMBULATORY_CARE_PROVIDER_SITE_OTHER): Payer: Self-pay | Admitting: Surgery

## 2013-06-28 VITALS — BP 161/87 | HR 81 | Ht 68.0 in | Wt 130.4 lb

## 2013-06-28 DIAGNOSIS — I714 Abdominal aortic aneurysm, without rupture, unspecified: Secondary | ICD-10-CM | POA: Insufficient documentation

## 2013-06-28 NOTE — Progress Notes (Signed)
This is the patient's first postoperative visit.  On 05/25/2013, he underwent an aortobifemoral bypass graft.  This was done for bilateral claudication, left greater than right, as well as a 4 cm abdominal aortic aneurysm.  The patient has a history of iliac stenting.  The left iliac stent has occluded.  He feels that he is making daily progress.  He is having regular bowel movements.  He continues to take pain medicine for residual abdominal pain.  His appetite is improving.  He is drinking multiple ensure shakes per day.  He is ambulating without difficulty.  He has been somewhat limited in the amount of activity he has undertaken.  On examination his midline and bilateral groin incisions are healing nicely without evidence of hernia.  He has palpable pedal pulses.  Overall, he is improving nicely, however a little on the slow side.  I have encouraged him to be multiple frequent small meals to help with his early satiety.  This should resolve with time.  I have also encouraged him to increase his walking activity while restricting lifting heavy objects for another 2-4 weeks.  I have been scheduled for followup in 2 months.  He'll contact me sooner if needed.

## 2013-08-06 ENCOUNTER — Encounter: Payer: Self-pay | Admitting: Surgery

## 2013-08-09 ENCOUNTER — Encounter: Payer: Self-pay | Admitting: Surgery

## 2013-08-09 ENCOUNTER — Ambulatory Visit (INDEPENDENT_AMBULATORY_CARE_PROVIDER_SITE_OTHER): Payer: Self-pay | Admitting: Surgery

## 2013-08-09 VITALS — BP 134/68 | HR 64 | Ht 68.0 in | Wt 155.0 lb

## 2013-08-09 DIAGNOSIS — I714 Abdominal aortic aneurysm, without rupture, unspecified: Secondary | ICD-10-CM

## 2013-08-09 NOTE — Progress Notes (Signed)
Patient name: Max [name] MRN: [id number] DOB: Oct 14, 1955 Sex: male     Chief Complaint  Patient presents with  . Re-evaluation    2 month f/u     HISTORY OF PRESENT ILLNESS: On 05/25/2013, he underwent an aortobifemoral bypass graft. This was done for bilateral claudication, left greater than right, as well as a 4 cm abdominal aortic aneurysm. The patient has a history of iliac stenting. The left iliac stent has occluded.  The patient has had weight loss but now states that he has gone from 129 up to 142.  He still uses supplements.  He still complains of pain in his abdomen but noticed that it is getting better.  His walking is dramatically improved.   Past Medical History  Diagnosis Date  . CAD (coronary artery disease), native coronary artery 2001    PCI RCA, low risk Nuc 11/13  . LV dysfunction 03/2012    Echo -  EF 45-50%, mod Conc LVH, mid Inf HK  . Normal cardiac stress test 04/07/2012    Lexiscan myoview; EF 45%, no ischemia or infarct  . Hyperlipidemia 04/07/2012  . PAD (peripheral artery disease) 12/07/12    with stent-bil. iliac, RCIA PTA 12/07/12  . Back pain, chronic     multiple back surgeries   . AAA (abdominal aortic aneurysm)     mild aneurysmal dilatation  . Near syncope 03/2012    Event monitor - PACs, PVCs - no arrhythmia  . Hypertension   . Heart murmur     "outgrew it" (01/19/2013)  . MI (myocardial infarction) 2001    at [facility]; PCI - RCA  . Asthma   . Pneumonia     "numerous times; last time ~ a year or 2 ago" (01/19/2013)  . Chronic bronchitis     "q couple years or so" (01/19/2013)  . Stroke 2014    "little one; little numbness and tingling in left thumb and left facial expressions not the same since" (01/19/2013)  . Basal cell cancer   . Right renal artery stenosis 12/07/12    Rt RAS  . Kidney stone   . Nonfunctioning kidney     "right kidney has atrophied to 1/3 size of the left" (01/19/2013)  . DM (diabetes mellitus) type II  controlled peripheral vascular disorder     no med since 6/13 lost unexplained weight  . Arthritis     Past Surgical History  Procedure Laterality Date  . Coronary angioplasty with stent placement  2001    to RCA @ [facility]  . Lumbar laminectomy  1995, 2000,2001    L5-S1  . Iliac artery stent Bilateral     "I have 6 total" (01/19/2013)  . Iliac artery stent Right 12/07/2012    Dr Gwenlyn Found  . Nasal sinus surgery    . Lithotripsy      "twice I think" (01/19/2013)  . Abdominal aortagram  01/19/2013    unsuccessful attempt at crossing a moderately long segment calcified proximal left common iliac artery Archie Endo 01/19/2013  . Basal cell carcinoma excision      "numerous; all over my face, arms, back" (01/19/2013)  . Aorta - bilateral femoral artery bypass graft N/A 05/25/2013    Procedure: AORTA BIFEMORAL BYPASS GRAFT;  Surgeon: Serafina Mitchell, MD;  Location: [place] [facility] OR;  Service: Vascular;  Laterality: N/A;    History   Social History  . Marital Status: Single    Spouse Name: N/A    Number of  Children: 0  . Years of Education: N/A   Occupational History  .  [employer]   Social History Main Topics  . Smoking status: Former Smoker -- 1.50 packs/day for 40 years    Types: Cigarettes    Quit date: 07/26/2012  . Smokeless tobacco: Former Systems developer    Types: Chew     Comment: 01/19/2013 "quit chewing 25-30 yr ago"  . Alcohol Use: No  . Drug Use: No  . Sexual Activity: Not Currently   Other Topics Concern  . Not on file   Social History Narrative   He is a single gentleman. He swims several days a week.  He is now almost a year out from IV sedation were no longer using nicotine in his electronic cigarette.   Occasional alcohol beverage.   He works as a Dealer.    Family History  Problem Relation Age of Onset  . Heart failure Mother     died @ 36  . Heart attack Father     died @ 10  . Stroke Paternal Grandmother   . Heart failure Maternal Grandfather     Allergies as of  08/09/2013 - Review Complete 08/09/2013  Allergen Reaction Noted  . Carvedilol Other (See Comments) 12/24/2012    Current Outpatient Prescriptions on File Prior to Visit  Medication Sig Dispense Refill  . albuterol (PROVENTIL HFA;VENTOLIN HFA) 108 (90 BASE) MCG/ACT inhaler Inhale 2 puffs into the lungs every 6 (six) hours as needed for wheezing or shortness of breath.       . ALPRAZolam (XANAX) 0.5 MG tablet Take 0.5 mg by mouth every evening. Take 1 tablet in morning and 2 tablet at bedtime ( Per pt 1/2 tablet at bedtime only)      . amLODipine (NORVASC) 10 MG tablet TAKE 1 TABLET BY MOUTH EVERY DAY  30 tablet  8  . aspirin 81 MG EC tablet Take 81 mg by mouth every morning.       [name] atorvastatin (LIPITOR) 80 MG tablet TAKE 1 TABLET BY MOUTH EVERY DAY  30 tablet  8  . clopidogrel (PLAVIX) 75 MG tablet Take 75 mg by mouth every morning.       . folic acid (FOLVITE) 1 MG tablet Take 1 mg by mouth daily. Take 2 tablet in the morning      . KLOR-CON M10 10 MEQ tablet Take 2 tablets by mouth daily.       [name] KLOR-CON M10 10 MEQ tablet TAKE 2 TABLETS IN THE MORNING  60 tablet  8  . lisinopril (PRINIVIL,ZESTRIL) 20 MG tablet Take 20 mg by mouth 2 (two) times daily.      . nitroGLYCERIN (NITROSTAT) 0.4 MG SL tablet Place 1 tablet (0.4 mg total) under the tongue every 5 (five) minutes as needed for chest pain (take for severe chest pressure ot tightness).  25 tablet  2  . tamsulosin (FLOMAX) 0.4 MG CAPS capsule Take 0.4 mg by mouth daily after supper.      . valsartan-hydrochlorothiazide (DIOVAN-HCT) 320-25 MG per tablet TAKE 1 TABLET BY MOUTH EVERY DAY  30 tablet  8  . hydrALAZINE (APRESOLINE) 25 MG tablet Take 1 tablet by mouth 2 (two) times daily.      [name] oxyCODONE (OXY IR/ROXICODONE) 5 MG immediate release tablet Take 1-2 tablets (5-10 mg total) by mouth every 4 (four) hours as needed for severe pain (we will try to wean him off the PCA).  40 tablet  0   No  current facility-administered medications on  file prior to visit.       PHYSICAL EXAMINATION:   Vital signs are BP 134/68  Pulse 64  Ht 5\' 8"  (1.727 m)  Wt 155 lb (70.308 kg)  BMI 23.57 kg/m2  SpO2 100% Palpable pedal pulses bilaterally.  All incisions have healed nicely.  No evidence of hernia.   Diagnostic Studies None  Assessment: Status post aortobifemoral bypass for aneurysmal disease as well as occlusive disease Plan: I reassured the patient that he is recovering as expected.  He is not quite back to his preoperative baseline, however he is making progress.  He still has some discomfort from his abdominal incision which is not unanticipated.  He has excellent pulses distally.  His appetite has slowly improved.  He is now ready to return to work.  I feel he can do so without restriction.  He will followup again in 2 months to make sure that he has completely recovered.  Eldridge Abrahams, M.D. [facility] [place] Office: [phone] Pager:  [phone]

## 2013-08-09 NOTE — Addendum Note (Signed)
Addended by: Mena Goes on: 08/09/2013 03:54 PM   Modules accepted: Orders

## 2013-08-12 DIAGNOSIS — Z0279 Encounter for issue of other medical certificate: Secondary | ICD-10-CM

## 2013-10-08 ENCOUNTER — Encounter: Payer: Self-pay | Admitting: Surgery

## 2013-10-11 ENCOUNTER — Encounter: Payer: Self-pay | Admitting: Surgery

## 2013-10-11 ENCOUNTER — Other Ambulatory Visit: Payer: Self-pay | Admitting: *Deleted

## 2013-10-11 ENCOUNTER — Ambulatory Visit (INDEPENDENT_AMBULATORY_CARE_PROVIDER_SITE_OTHER): Payer: Federal, State, Local not specified - PPO | Admitting: Surgery

## 2013-10-11 ENCOUNTER — Ambulatory Visit (HOSPITAL_COMMUNITY)
Admission: RE | Admit: 2013-10-11 | Discharge: 2013-10-11 | Disposition: A | Discharge status: Home / Self Care | Payer: Federal, State, Local not specified - PPO | Source: Ambulatory Visit | Attending: Surgery | Admitting: Surgery

## 2013-10-11 VITALS — BP 167/84 | HR 59 | Ht 68.0 in | Wt 155.0 lb

## 2013-10-11 DIAGNOSIS — I714 Abdominal aortic aneurysm, without rupture, unspecified: Secondary | ICD-10-CM

## 2013-10-11 DIAGNOSIS — Z09 Encounter for follow-up examination after completed treatment for conditions other than malignant neoplasm: Secondary | ICD-10-CM | POA: Insufficient documentation

## 2013-10-11 DIAGNOSIS — Z9889 Other specified postprocedural states: Secondary | ICD-10-CM | POA: Insufficient documentation

## 2013-10-11 MED ORDER — METOCLOPRAMIDE HCL 10 MG PO TABS: 10.0000 mg | ORAL_TABLET | Freq: Four times a day (QID) | ORAL | Status: DC

## 2013-10-11 NOTE — Progress Notes (Signed)
Patient name: Max [name] MRN: [id number] DOB: December 06, 1955 Sex: male     Chief Complaint  Patient presents with  . Re-evaluation    2 month f/u - c/o L LQ and R groin pain    HISTORY OF PRESENT ILLNESS: On 05/25/2013, he underwent an aortobifemoral bypass graft. This was done for bilateral claudication, left greater than right, as well as a 4 cm abdominal aortic aneurysm. The patient has a history of iliac stenting. The left iliac stent has occluded.  He continues to gain weight, however his appetite has not been very good.  He reports early satiety.  He is gaining weight mainly from the amount of boost he is drinking.  He still complains of some pain in his proximal incision on the left side.   Past Medical History  Diagnosis Date  . CAD (coronary artery disease), native coronary artery 2001    PCI RCA, low risk Nuc 11/13  . LV dysfunction 03/2012    Echo -  EF 45-50%, mod Conc LVH, mid Inf HK  . Normal cardiac stress test 04/07/2012    Lexiscan myoview; EF 45%, no ischemia or infarct  . Hyperlipidemia 04/07/2012  . PAD (peripheral artery disease) 12/07/12    with stent-bil. iliac, RCIA PTA 12/07/12  . Back pain, chronic     multiple back surgeries   . AAA (abdominal aortic aneurysm)     mild aneurysmal dilatation  . Near syncope 03/2012    Event monitor - PACs, PVCs - no arrhythmia  . Hypertension   . Heart murmur     "outgrew it" (01/19/2013)  . MI (myocardial infarction) 2001    at [facility]; PCI - RCA  . Asthma   . Pneumonia     "numerous times; last time ~ a year or 2 ago" (01/19/2013)  . Chronic bronchitis     "q couple years or so" (01/19/2013)  . Stroke 2014    "little one; little numbness and tingling in left thumb and left facial expressions not the same since" (01/19/2013)  . Basal cell cancer   . Right renal artery stenosis 12/07/12    Rt RAS  . Kidney stone   . Nonfunctioning kidney     "right kidney has atrophied to 1/3 size of the left"  (01/19/2013)  . DM (diabetes mellitus) type II controlled peripheral vascular disorder     no med since 6/13 lost unexplained weight  . Arthritis     Past Surgical History  Procedure Laterality Date  . Coronary angioplasty with stent placement  2001    to RCA @ [facility]  . Lumbar laminectomy  1995, 2000,2001    L5-S1  . Iliac artery stent Bilateral     "I have 6 total" (01/19/2013)  . Iliac artery stent Right 12/07/2012    Dr Gwenlyn Found  . Nasal sinus surgery    . Lithotripsy      "twice I think" (01/19/2013)  . Abdominal aortagram  01/19/2013    unsuccessful attempt at crossing a moderately long segment calcified proximal left common iliac artery Archie Endo 01/19/2013  . Basal cell carcinoma excision      "numerous; all over my face, arms, back" (01/19/2013)  . Aorta - bilateral femoral artery bypass graft N/A 05/25/2013    Procedure: AORTA BIFEMORAL BYPASS GRAFT;  Surgeon: Serafina Mitchell, MD;  Location: [place] [facility];  Service: Vascular;  Laterality: N/A;    History   Social History  . Marital Status: Single  Spouse Name: N/A    Number of Children: 0  . Years of Education: N/A   Occupational History  .  [employer]   Social History Main Topics  . Smoking status: Former Smoker -- 1.50 packs/day for 40 years    Types: Cigarettes    Quit date: 07/26/2012  . Smokeless tobacco: Former Systems developer    Types: Chew     Comment: 01/19/2013 "quit chewing 25-30 yr ago"  . Alcohol Use: No  . Drug Use: No  . Sexual Activity: Not Currently   Other Topics Concern  . Not on file   Social History Narrative   He is a single gentleman. He swims several days a week.  He is now almost a year out from IV sedation were no longer using nicotine in his electronic cigarette.   Occasional alcohol beverage.   He works as a Dealer.    Family History  Problem Relation Age of Onset  . Heart failure Mother     died @ 77  . Heart attack Father     died @ 52  . Stroke Paternal Grandmother   . Heart failure  Maternal Grandfather     Allergies as of 10/11/2013 - Review Complete 10/11/2013  Allergen Reaction Noted  . Carvedilol Other (See Comments) 12/24/2012    Current Outpatient Prescriptions on File Prior to Visit  Medication Sig Dispense Refill  . albuterol (PROVENTIL HFA;VENTOLIN HFA) 108 (90 BASE) MCG/ACT inhaler Inhale 2 puffs into the lungs every 6 (six) hours as needed for wheezing or shortness of breath.       . ALPRAZolam (XANAX) 0.5 MG tablet Take 0.5 mg by mouth every evening. Take 1 tablet in morning and 2 tablet at bedtime ( Per pt 1/2 tablet at bedtime only)      . amLODipine (NORVASC) 10 MG tablet TAKE 1 TABLET BY MOUTH EVERY DAY  30 tablet  8  . aspirin 81 MG EC tablet Take 81 mg by mouth every morning.       [name] atorvastatin (LIPITOR) 80 MG tablet TAKE 1 TABLET BY MOUTH EVERY DAY  30 tablet  8  . clopidogrel (PLAVIX) 75 MG tablet Take 75 mg by mouth every morning.       . folic acid (FOLVITE) 1 MG tablet Take 1 mg by mouth daily. Take 2 tablet in the morning      . KLOR-CON M10 10 MEQ tablet Take 2 tablets by mouth daily.       [name] KLOR-CON M10 10 MEQ tablet TAKE 2 TABLETS IN THE MORNING  60 tablet  8  . lisinopril (PRINIVIL,ZESTRIL) 20 MG tablet Take 20 mg by mouth 2 (two) times daily.      . nitroGLYCERIN (NITROSTAT) 0.4 MG SL tablet Place 1 tablet (0.4 mg total) under the tongue every 5 (five) minutes as needed for chest pain (take for severe chest pressure ot tightness).  25 tablet  2  . tamsulosin (FLOMAX) 0.4 MG CAPS capsule Take 0.4 mg by mouth daily after supper.      . valsartan-hydrochlorothiazide (DIOVAN-HCT) 320-25 MG per tablet TAKE 1 TABLET BY MOUTH EVERY DAY  30 tablet  8  . hydrALAZINE (APRESOLINE) 25 MG tablet Take 1 tablet by mouth 2 (two) times daily.      [name] oxyCODONE (OXY IR/ROXICODONE) 5 MG immediate release tablet Take 1-2 tablets (5-10 mg total) by mouth every 4 (four) hours as needed for severe pain (we will try to wean him off the PCA).  40 tablet  0   No  current facility-administered medications on file prior to visit.     REVIEW OF SYSTEMS: Please see history of present illness, otherwise all systems negative  PHYSICAL EXAMINATION:   Vital signs are BP 167/84  Pulse 59  Ht 5\' 8"  (1.727 m)  Wt 155 lb (70.308 kg)  BMI 23.57 kg/m2  SpO2 100% General: The patient appears their stated age. HEENT:  No gross abnormalities Pulmonary:  Non labored breathing Abdomen: Soft and non-tender.  Midline incision is well-healed without hernia Musculoskeletal: There are no major deformities. Neurologic: No focal weakness or paresthesias are detected, Skin: There are no ulcer or rashes noted. Psychiatric: The patient has normal affect. Cardiovascular: There is a regular rate and rhythm without significant murmur appreciated.  Palpable femoral pulses   Diagnostic Studies None  Assessment: Status post aortobifemoral bypass graft Plan: The patient continues to make progress.  He actually was very healthy today.  He has continued to gain weight despite problems with early satiety.  Prior to losing so much weight, he was a diabetic.  I wonder if he is having gastroparesis.  I started him on 10 mg of Reglan 4 times a day today.  I'm going to have a followup again in 3 months for evaluation.  Eldridge Abrahams, M.D. [facility] Office: [phone] Pager:  [phone]

## 2013-10-12 ENCOUNTER — Other Ambulatory Visit: Payer: Self-pay | Admitting: *Deleted

## 2013-10-12 MED ORDER — METOCLOPRAMIDE HCL 10 MG PO TABS: 10.0000 mg | ORAL_TABLET | Freq: Four times a day (QID) | ORAL | Status: AC

## 2013-10-12 NOTE — Addendum Note (Signed)
Addended by: Mena Goes on: 10/12/2013 10:41 AM   Modules accepted: Orders

## 2014-01-07 ENCOUNTER — Encounter: Payer: Self-pay | Admitting: Surgery

## 2014-01-10 ENCOUNTER — Other Ambulatory Visit: Payer: Self-pay | Admitting: Surgery

## 2014-01-10 ENCOUNTER — Ambulatory Visit (HOSPITAL_COMMUNITY)
Admission: RE | Admit: 2014-01-10 | Discharge: 2014-01-10 | Disposition: A | Discharge status: Home / Self Care | Payer: Federal, State, Local not specified - PPO | Source: Ambulatory Visit | Attending: Surgery | Admitting: Surgery

## 2014-01-10 ENCOUNTER — Encounter: Payer: Self-pay | Admitting: Surgery

## 2014-01-10 ENCOUNTER — Ambulatory Visit (INDEPENDENT_AMBULATORY_CARE_PROVIDER_SITE_OTHER): Payer: Federal, State, Local not specified - PPO | Admitting: Surgery

## 2014-01-10 VITALS — BP 129/78 | HR 62 | Ht 68.0 in | Wt 180.1 lb

## 2014-01-10 DIAGNOSIS — I714 Abdominal aortic aneurysm, without rupture, unspecified: Secondary | ICD-10-CM

## 2014-01-10 DIAGNOSIS — Z48812 Encounter for surgical aftercare following surgery on the circulatory system: Secondary | ICD-10-CM

## 2014-01-10 NOTE — Progress Notes (Signed)
Patient name: Max [name] MRN: [id number] DOB: [birth year] Sex: male     Chief Complaint  Patient presents with  . Re-evaluation    3 month f/u     HISTORY OF PRESENT ILLNESS: The patient is back today for followup.  On 05/25/2013, he underwent an aortobifemoral bypass graft.  This was done for bilateral claudication as well as a 4 cm aneurysm.  The patient had a history of iliac stenting.  The left side has occluded.  He states that his claudication symptoms have now resolved.  He is tolerating a regular diet and has been gaining weight.  He does report recurrence of his back pain.  This predates his operation.  He is having regular bowel movements  Past Medical History  Diagnosis Date  . CAD (coronary artery disease), native coronary artery 2001    PCI RCA, low risk Nuc 11/13  . LV dysfunction 03/2012    Echo -  EF 45-50%, mod Conc LVH, mid Inf HK  . Normal cardiac stress test 04/07/2012    Lexiscan myoview; EF 45%, no ischemia or infarct  . Hyperlipidemia 04/07/2012  . PAD (peripheral artery disease) 12/07/12    with stent-bil. iliac, RCIA PTA 12/07/12  . Back pain, chronic     multiple back surgeries   . AAA (abdominal aortic aneurysm)     mild aneurysmal dilatation  . Near syncope 03/2012    Event monitor - PACs, PVCs - no arrhythmia  . Hypertension   . Heart murmur     "outgrew it" (01/19/2013)  . MI (myocardial infarction) 2001    at [facility]; PCI - RCA  . Asthma   . Pneumonia     "numerous times; last time ~ a year or 2 ago" (01/19/2013)  . Chronic bronchitis     "q couple years or so" (01/19/2013)  . Stroke 2014    "little one; little numbness and tingling in left thumb and left facial expressions not the same since" (01/19/2013)  . Basal cell cancer   . Right renal artery stenosis 12/07/12    Rt RAS  . Kidney stone   . Nonfunctioning kidney     "right kidney has atrophied to 1/3 size of the left" (01/19/2013)  . DM (diabetes mellitus) type II  controlled peripheral vascular disorder     no med since 6/13 lost unexplained weight  . Arthritis   . COPD (chronic obstructive pulmonary disease)   . DVT (deep venous thrombosis)     Past Surgical History  Procedure Laterality Date  . Coronary angioplasty with stent placement  2001    to RCA @ [facility]  . Lumbar laminectomy  1995, 2000,2001    L5-S1  . Iliac artery stent Bilateral     "I have 6 total" (01/19/2013)  . Iliac artery stent Right 12/07/2012    Dr Gwenlyn Found  . Nasal sinus surgery    . Lithotripsy      "twice I think" (01/19/2013)  . Abdominal aortagram  01/19/2013    unsuccessful attempt at crossing a moderately long segment calcified proximal left common iliac artery Archie Endo 01/19/2013  . Basal cell carcinoma excision      "numerous; all over my face, arms, back" (01/19/2013)  . Aorta - bilateral femoral artery bypass graft N/A 05/25/2013    Procedure: AORTA BIFEMORAL BYPASS GRAFT;  Surgeon: Serafina Mitchell, MD;  Location: [place] [facility];  Service: Vascular;  Laterality: N/A;    History   Social History  .  Marital Status: Single    Spouse Name: N/A    Number of Children: 0  . Years of Education: N/A   Occupational History  .  [employer]   Social History Main Topics  . Smoking status: Former Smoker -- 1.50 packs/day for 40 years    Types: Cigarettes    Quit date: 07/26/2012  . Smokeless tobacco: Former Systems developer    Types: Chew     Comment: 01/19/2013 "quit chewing 25-30 yr ago"  . Alcohol Use: No  . Drug Use: No  . Sexual Activity: Not Currently   Other Topics Concern  . Not on file   Social History Narrative   He is a single gentleman. He swims several days a week.  He is now almost a year out from IV sedation were no longer using nicotine in his electronic cigarette.   Occasional alcohol beverage.   He works as a Dealer.    Family History  Problem Relation Age of Onset  . Heart failure Mother     died @ 67  . Cancer Mother   . Diabetes Mother   .  Hypertension Mother   . Hyperlipidemia Mother   . Heart disease Mother     before age 56  . Heart attack Mother   . Heart attack Father     died @ 61  . Diabetes Father   . Hypertension Father   . Hyperlipidemia Father   . Heart disease Father     before age 83  . Stroke Paternal Grandmother   . Heart failure Maternal Grandfather     Allergies as of 01/10/2014 - Review Complete 58/17/2015  Allergen Reaction Noted  . Carvedilol Other (See Comments) 12/24/2012    Current Outpatient Prescriptions on File Prior to Visit  Medication Sig Dispense Refill  . amLODipine (NORVASC) 10 MG tablet TAKE 1 TABLET BY MOUTH EVERY DAY  30 tablet  8  . aspirin 81 MG EC tablet Take 81 mg by mouth every morning.       [name] atorvastatin (LIPITOR) 80 MG tablet TAKE 1 TABLET BY MOUTH EVERY DAY  30 tablet  8  . clopidogrel (PLAVIX) 75 MG tablet Take 75 mg by mouth every morning.       . folic acid (FOLVITE) 1 MG tablet Take 1 mg by mouth daily. Take 2 tablet in the morning      . KLOR-CON M10 10 MEQ tablet TAKE 2 TABLETS IN THE MORNING  60 tablet  8  . lisinopril (PRINIVIL,ZESTRIL) 20 MG tablet Take 20 mg by mouth 2 (two) times daily.      . metoCLOPramide (REGLAN) 10 MG tablet Take 1 tablet (10 mg total) by mouth 4 (four) times daily.  120 tablet  5  . valsartan-hydrochlorothiazide (DIOVAN-HCT) 320-25 MG per tablet TAKE 1 TABLET BY MOUTH EVERY DAY  30 tablet  8  . albuterol (PROVENTIL HFA;VENTOLIN HFA) 108 (90 BASE) MCG/ACT inhaler Inhale 2 puffs into the lungs every 6 (six) hours as needed for wheezing or shortness of breath.       . ALPRAZolam (XANAX) 0.5 MG tablet Take 0.5 mg by mouth every evening. Take 1 tablet in morning and 2 tablet at bedtime ( Per pt 1/2 tablet at bedtime only)      . hydrALAZINE (APRESOLINE) 25 MG tablet Take 1 tablet by mouth 2 (two) times daily.      [name] KLOR-CON M10 10 MEQ tablet Take 2 tablets by mouth daily.       [name]  nitroGLYCERIN (NITROSTAT) 0.4 MG SL tablet Place 1 tablet (0.4  mg total) under the tongue every 5 (five) minutes as needed for chest pain (take for severe chest pressure ot tightness).  25 tablet  2  . oxyCODONE (OXY IR/ROXICODONE) 5 MG immediate release tablet Take 1-2 tablets (5-10 mg total) by mouth every 4 (four) hours as needed for severe pain (we will try to wean him off the PCA).  40 tablet  0  . tamsulosin (FLOMAX) 0.4 MG CAPS capsule Take 0.4 mg by mouth daily after supper.       No current facility-administered medications on file prior to visit.     REVIEW OF SYSTEMS: Please see history of present illness, otherwise all systems negative.  PHYSICAL EXAMINATION:   Vital signs are BP 129/78  Pulse 62  Ht 5\' 8"  (1.727 m)  Wt 180 lb 1.6 oz (81.693 kg)  BMI 27.39 kg/m2  SpO2 100% General: The patient appears their stated age. HEENT:  No gross abnormalities Pulmonary:  Non labored breathing Abdomen: Soft and non-tender, no incisional hernia. Musculoskeletal: There are no major deformities. Neurologic: No focal weakness or paresthesias are detected, Skin: There are no ulcer or rashes noted. Psychiatric: The patient has normal affect. Cardiovascular: There is a regular rate and rhythm without significant murmur appreciated.  Palpable pedal pulses.   Diagnostic Studies I have reviewed his ultrasound from today.  This shows an ankle-brachial index of 1.0 bilaterally with triphasic waveforms.  Assessment: Status post aortobifemoral bypass graft for aneurysmal disease as well as occlusive disease Plan: The patient is doing very well, has made a near complete recovery.  We discussed the importance of surveillance today.  I have scheduled him to come back in one year for ankle-brachial indices.  He will likely need an abdominal ultrasound to evaluate for perianastomotic degeneration in approximately 3-5 years.  Eldridge Abrahams, M.D. [facility] [place] [place] Office: [phone] Pager:  [phone]

## 2014-01-12 NOTE — Addendum Note (Signed)
Addended by: Mena Goes on: 01/12/2014 09:57 AM   Modules accepted: Orders

## 2014-03-11 ENCOUNTER — Other Ambulatory Visit: Payer: Self-pay

## 2014-03-14 ENCOUNTER — Ambulatory Visit: Payer: Federal, State, Local not specified - PPO | Admitting: Surgery

## 2014-03-14 ENCOUNTER — Other Ambulatory Visit (HOSPITAL_COMMUNITY): Payer: Federal, State, Local not specified - PPO

## 2014-05-05 ENCOUNTER — Encounter (HOSPITAL_COMMUNITY): Payer: Self-pay | Admitting: Cardiovascular Disease

## 2014-06-04 IMAGING — CT CT ANGIO CHEST
2 of 7 series · 14 of 31 positions shown · IV contrast (60CC OMNI 350)
Comparison: None.

CLINICAL DATA: Abdominal aortic aneurysm

EXAM:
CT ANGIOGRAPHY CHEST, ABDOMEN AND PELVIS
TECHNIQUE: Multidetector CT imaging through the chest, abdomen and pelvis was
performed using the standard protocol during bolus administration of
intravenous contrast. Multiplanar reconstructed images including
MIPs were obtained and reviewed to evaluate the vascular anatomy.
CONTRAST:  60mL OMNIPAQUE IOHEXOL 350 MG/ML SOLN

[Series 4: angio · axial · 0.70mm/px · z∈[-637,-67]mm · 12 of 268 slices shown]
[im 21/268  lung]
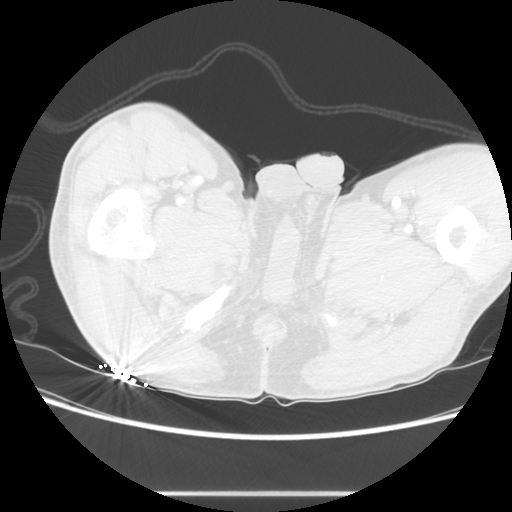
[im 42/268  mediastinal]
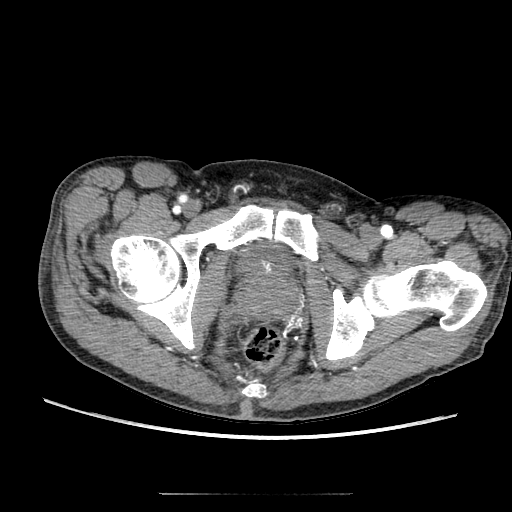
[im 62/268  lung]
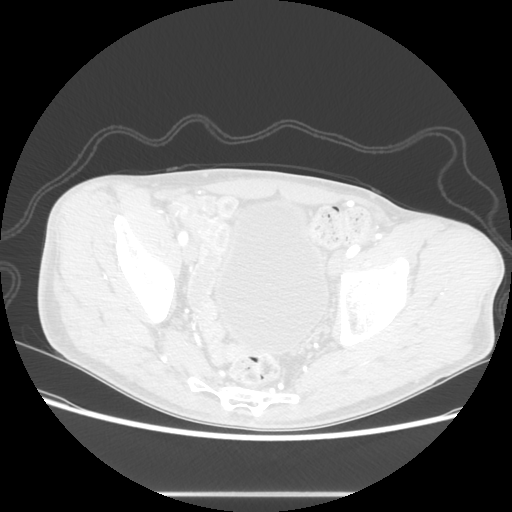
[im 83/268  mediastinal]
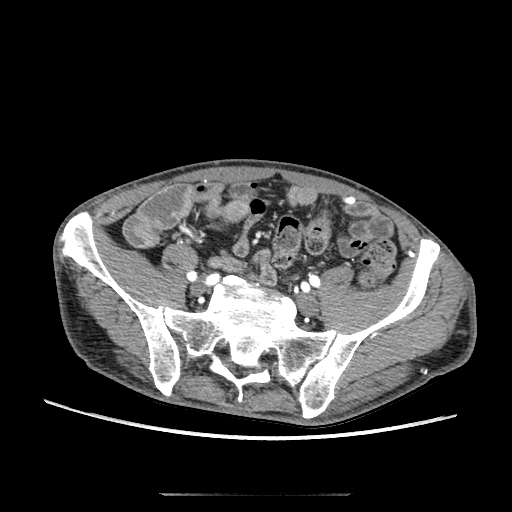
[im 103/268  lung]
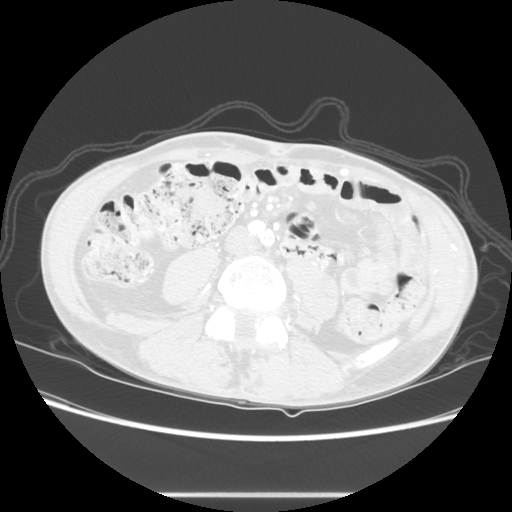
[im 124/268  mediastinal]
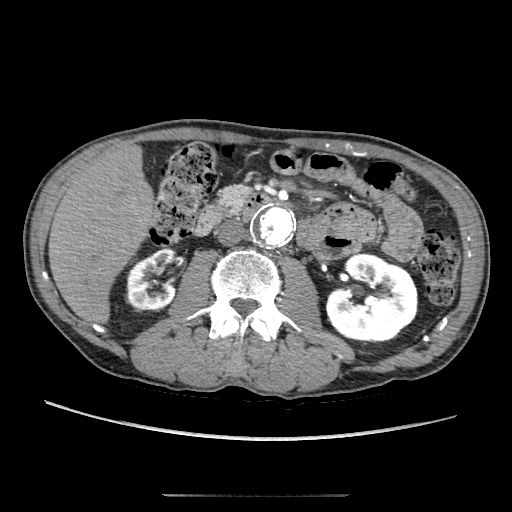
[im 144/268  lung]
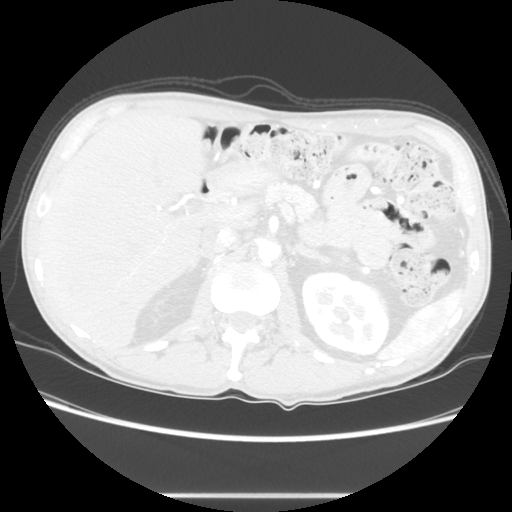
[im 165/268  mediastinal]
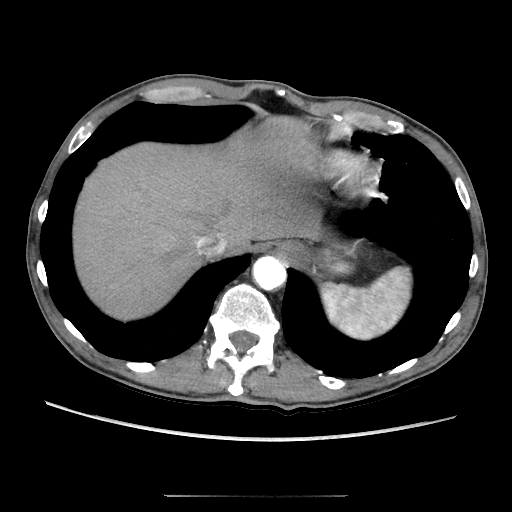
[im 185/268  lung]
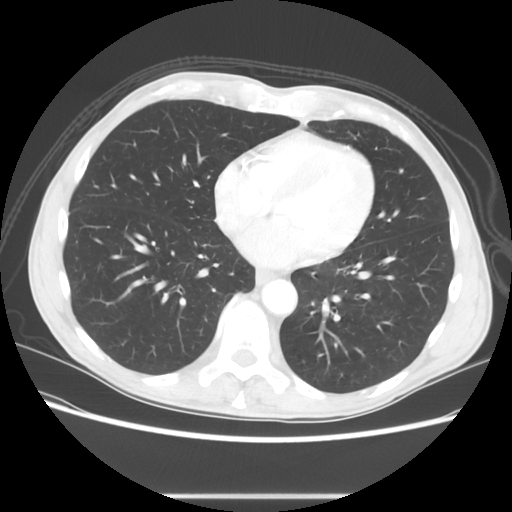
[im 206/268  mediastinal]
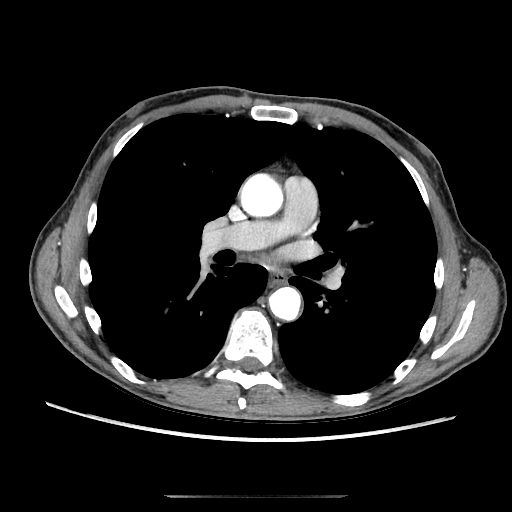
[im 226/268  lung]
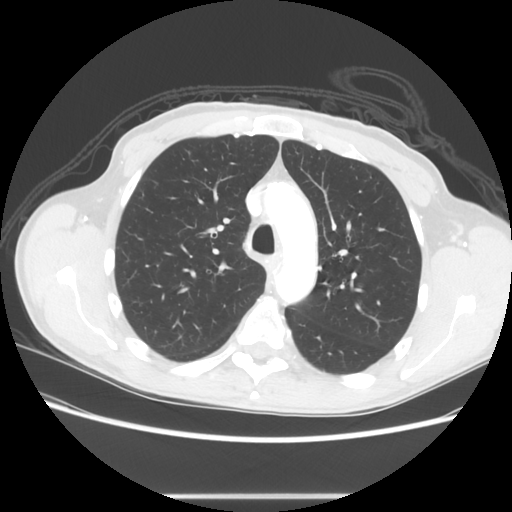
[im 247/268  mediastinal]
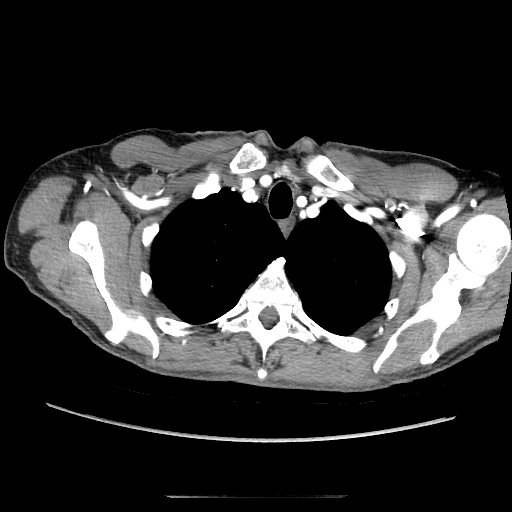

[Series 5: lung windows · axial · 0.70mm/px · z∈[-250,-134]mm · 2 of 71 slices shown]
[im 24/71  mediastinal]
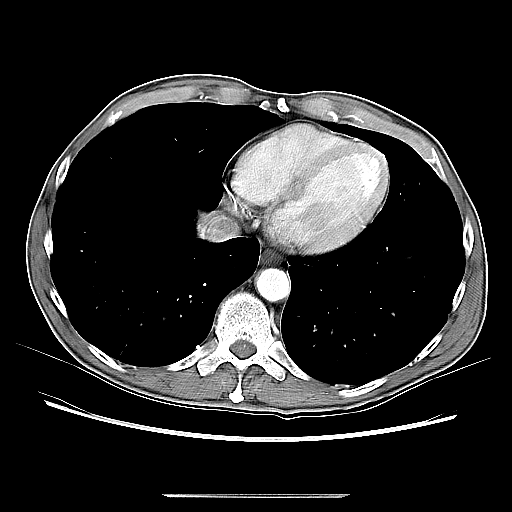
[im 47/71  mediastinal]
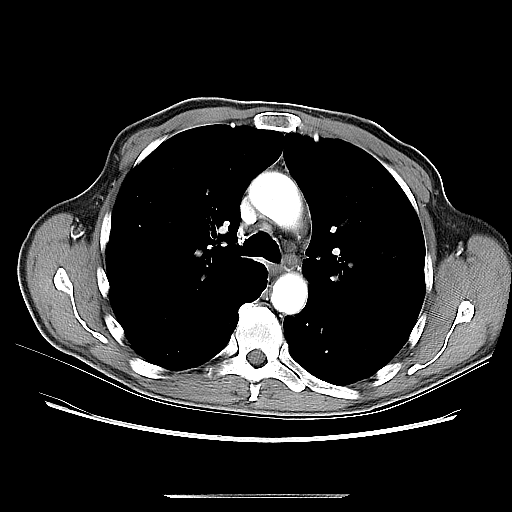

[14 of 31 positions shown; findings below may reference images not displayed]

FINDINGS: CTA CHEST FINDINGS

Scattered calcifications in the aortic arch and coronary arteries.
Classic 3 vessel brachiocephalic arterial origin anatomy without
high-grade stenosis. Negative for dissection, aneurysm, or stenosis.
Good contrast opacification of central pulmonary arterial branches ;
the exam was not optimized for detection of pulmonary emboli.

No pleural or pericardial effusion. No hilar or mediastinal
adenopathy. Lungs are clear. Usual spurring in the lower thoracic
spine.

Review of the MIP images confirms the above findings.

CTA ABDOMEN AND PELVIS FINDINGS

Arterial findings:

Aorta: Suprarenal segment normal an diameter. There is moderate
eccentric nonocclusive mural thrombus in the juxtarenal and
infrarenal segments. There is a fusiform infrarenal aneurysm, 3.1 x
3.7 cm maximum transverse dimensions. Irregular mural thrombus in
the aneurysm without stenosis. No dissection. Aorta tapers to a
diameter of 17 mm at its bifurcation. There are kissing iliac
stents.

Celiac axis:         Patent

Superior mesenteric: Ostial plaque, partially calcified, resulting
in less than 50% diameter stenosis. Patent distally with classic
distal branch anatomy.

Left renal:          Single, patent

Right renal: Single, with short-segment ostial occlusion over a
length of less than 1 cm, reconstituted distally.

Inferior mesenteric: High-grade origin stenosis or short segment
occlusion, reconstituted distally by visceral collaterals.

Left iliac: Occlusion of left common iliac origin stent and distal
common iliac artery, with collateral reconstitution at the common
iliac bifurcation. External and internal iliac arteries widely
patent. Eccentric plaque in the common femoral artery without
stenosis.

Right iliac: Patent stent in the proximal common iliac artery.
Native internal and external iliac arteries widely patent.

Venous findings: Venous phase imaging not obtained. Patent bilateral
renal veins, IVC, portal vein, SMV, splenic vein.

Review of the MIP images confirms the above findings.

Nonvascular findings: Unremarkable liver, nondilated gallbladder,
spleen, pancreas. There is marked right renal parenchymal atrophy
and compensatory hypertrophy of the left kidney without focal lesion
or hydronephrosis. Bilateral adrenal hyperplasia. Stomach, small
bowel, and colon are nondilated. Normal appendix. Urinary bladder
physiologically distended. Moderate prostatic enlargement with
central coarse calcifications. No ascites. No free air. No
adenopathy. Degenerative disc disease L5-S1, with previous posterior
decompression at L4-S1. .
IMPRESSION: 1. Atheromatous thoracic aorta without aneurysm or dissection.
2. 3.7 cm infrarenal aortic aneurysm
3. Thrombosed left common iliac artery and stent, with collateral
reconstitution of the proximal external iliac artery
4. Right renal artery origin occlusion with marked right renal
parenchymal atrophy

## 2014-06-06 IMAGING — DX DG CHEST 1V PORT
1 series · 1 of 1 positions shown · non-contrast
Comparison: 05/25/2013; 01/20/2013; chest CT -05/24/2013

CLINICAL DATA: Postoperative examination

EXAM:
PORTABLE CHEST - 1 VIEW

[portable]
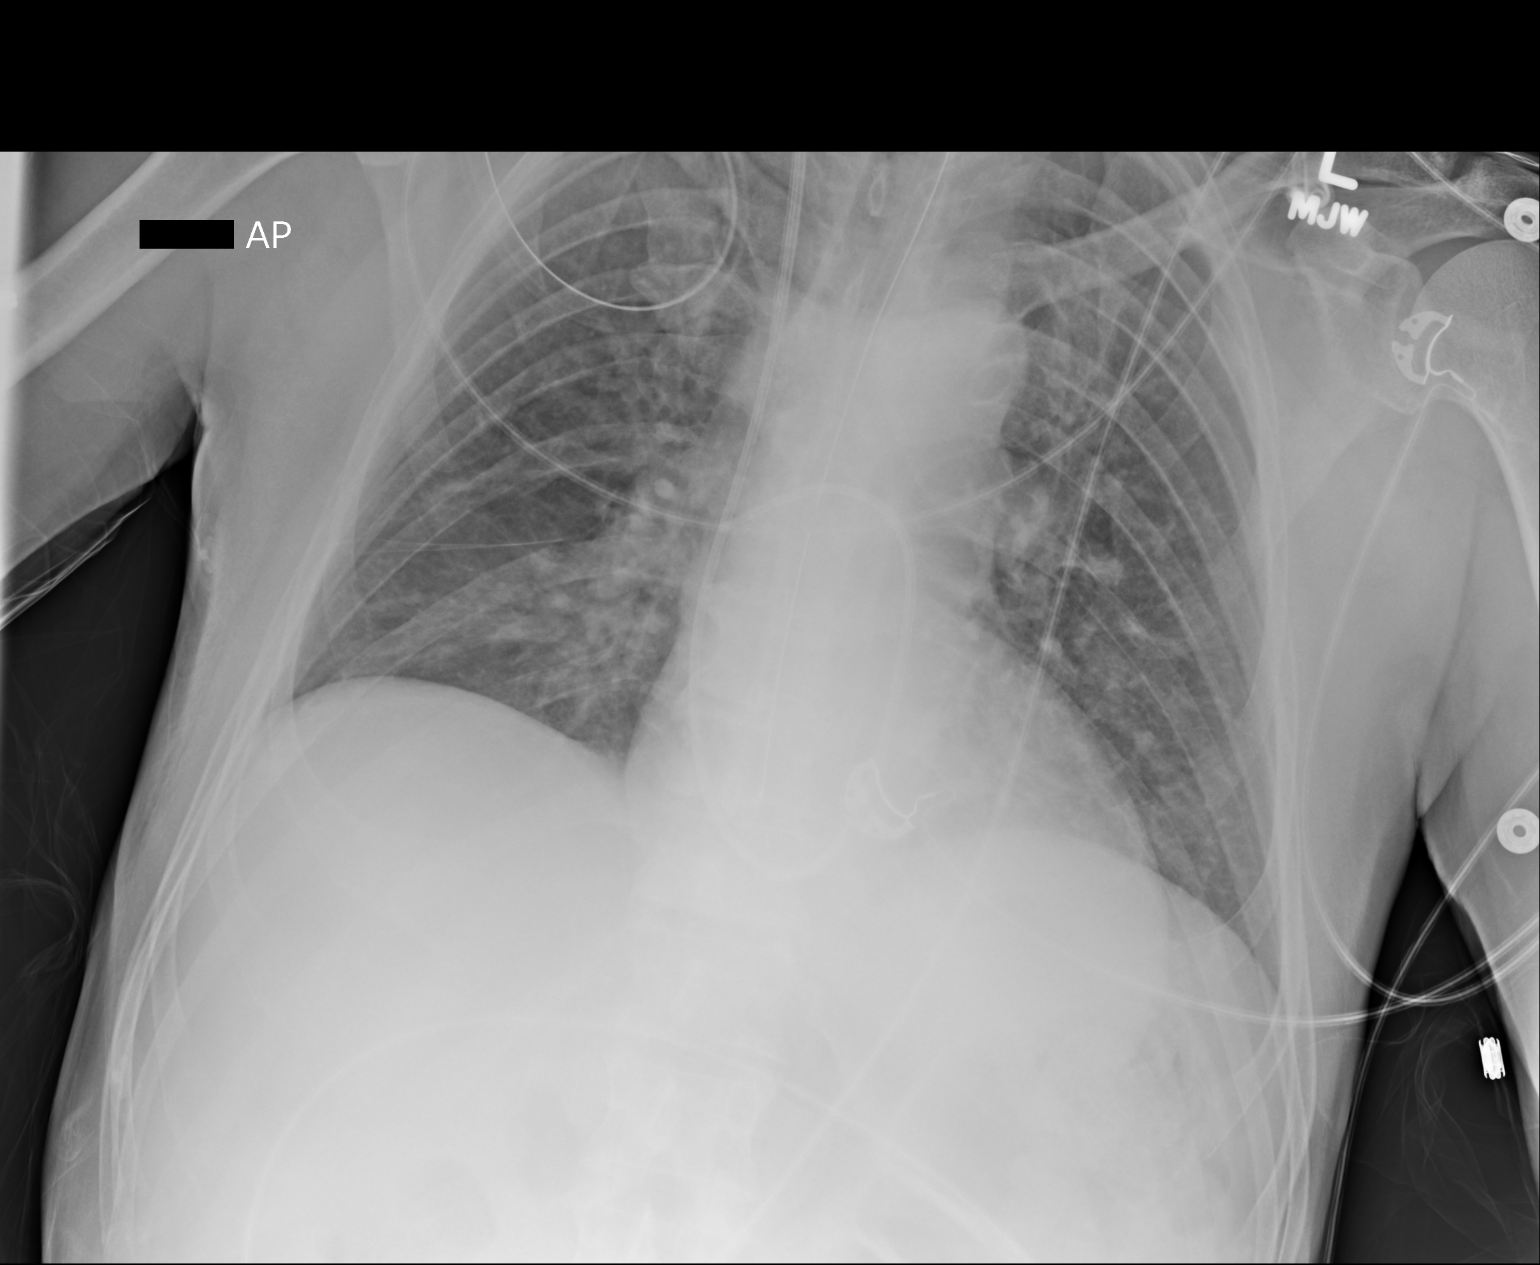

[1 of 1 positions shown; findings below may reference images not displayed]

FINDINGS: Grossly unchanged cardiac silhouette and mediastinal contours.
Interval retraction of enteric tube with sideport now projecting
over the mid esophagus. Otherwise, stable position of remaining
support apparatus. A skin fold overlies the left upper lung. No
definite pneumothorax. Pulmonary vasculature appears less distinct
on the present examination with cephalization of flow. There is a
minimal amount of fluid tracking within the right minor fissure.
Slight worsening of bilateral perihilar opacities, right greater
than left. No definite pleural effusion. Unchanged bones.
IMPRESSION: 1. Interval retraction of enteric tube with side port projected over
the mid esophagus - advancement approximately 20 cm is recommended.
Otherwise, stable positioning of remaining support apparatus. No
pneumothorax.
2. Decreased lung volumes with worsening bilateral infrahilar
opacities, right greater than left, atelectasis versus infiltrate.
3. Mild pulmonary edema.

## 2014-07-27 ENCOUNTER — Other Ambulatory Visit: Payer: Self-pay | Admitting: Cardiology

## 2014-07-28 NOTE — Telephone Encounter (Signed)
Rx(s) sent to pharmacy electronically.  

## 2014-11-21 ENCOUNTER — Other Ambulatory Visit: Payer: Self-pay

## 2015-01-13 ENCOUNTER — Encounter: Payer: Self-pay | Admitting: Family

## 2015-01-16 ENCOUNTER — Other Ambulatory Visit: Payer: Self-pay | Admitting: Surgery

## 2015-01-16 ENCOUNTER — Inpatient Hospital Stay (HOSPITAL_COMMUNITY): Admission: RE | Admit: 2015-01-16 | Payer: Federal, State, Local not specified - PPO | Source: Ambulatory Visit

## 2015-01-16 ENCOUNTER — Ambulatory Visit: Payer: Federal, State, Local not specified - PPO | Admitting: Family

## 2015-01-16 DIAGNOSIS — I714 Abdominal aortic aneurysm, without rupture, unspecified: Secondary | ICD-10-CM

## 2015-01-18 ENCOUNTER — Encounter: Payer: Self-pay | Admitting: Cardiology

## 2017-11-24 DEATH — deceased
